# Patient Record
Sex: Male | Born: 1950 | Race: Black or African American | Hispanic: No | Marital: Single | Smoking: Former smoker
Health system: Southern US, Community
[De-identification: ages and names within clinical notes are randomized; demographics above are authoritative.]

## PROBLEM LIST (undated history)

## (undated) DIAGNOSIS — E785 Hyperlipidemia, unspecified: Secondary | ICD-10-CM

## (undated) DIAGNOSIS — I214 Non-ST elevation (NSTEMI) myocardial infarction: Secondary | ICD-10-CM

## (undated) DIAGNOSIS — H409 Unspecified glaucoma: Secondary | ICD-10-CM

## (undated) DIAGNOSIS — J449 Chronic obstructive pulmonary disease, unspecified: Secondary | ICD-10-CM

## (undated) DIAGNOSIS — I2510 Atherosclerotic heart disease of native coronary artery without angina pectoris: Secondary | ICD-10-CM

## (undated) HISTORY — PX: CARDIAC CATHETERIZATION: SHX172

## (undated) HISTORY — DX: Unspecified glaucoma: H40.9

---

## 1998-10-01 ENCOUNTER — Encounter: Payer: Self-pay | Admitting: Emergency Medicine

## 1998-10-01 ENCOUNTER — Emergency Department (HOSPITAL_COMMUNITY): Admission: EM | Admit: 1998-10-01 | Discharge: 1998-10-01 | Payer: Self-pay | Admitting: Emergency Medicine

## 1998-10-07 ENCOUNTER — Encounter: Admission: RE | Admit: 1998-10-07 | Discharge: 1999-01-05 | Payer: Self-pay | Admitting: Family Medicine

## 2005-02-25 ENCOUNTER — Encounter: Admission: RE | Admit: 2005-02-25 | Discharge: 2005-02-25 | Payer: Self-pay | Admitting: Sports Medicine

## 2009-06-12 ENCOUNTER — Emergency Department (HOSPITAL_COMMUNITY): Admission: EM | Admit: 2009-06-12 | Discharge: 2009-06-12 | Payer: Self-pay | Admitting: Emergency Medicine

## 2010-11-24 LAB — STREP A DNA PROBE: Group A Strep Probe: NEGATIVE

## 2012-04-03 ENCOUNTER — Ambulatory Visit: Payer: Non-veteran care | Attending: Internal Medicine | Admitting: Physical Therapy

## 2012-04-03 DIAGNOSIS — R262 Difficulty in walking, not elsewhere classified: Secondary | ICD-10-CM | POA: Insufficient documentation

## 2012-04-03 DIAGNOSIS — M25676 Stiffness of unspecified foot, not elsewhere classified: Secondary | ICD-10-CM | POA: Insufficient documentation

## 2012-04-03 DIAGNOSIS — M25579 Pain in unspecified ankle and joints of unspecified foot: Secondary | ICD-10-CM | POA: Insufficient documentation

## 2012-04-03 DIAGNOSIS — IMO0001 Reserved for inherently not codable concepts without codable children: Secondary | ICD-10-CM | POA: Insufficient documentation

## 2012-04-03 DIAGNOSIS — M25673 Stiffness of unspecified ankle, not elsewhere classified: Secondary | ICD-10-CM | POA: Insufficient documentation

## 2012-04-11 ENCOUNTER — Ambulatory Visit: Payer: Non-veteran care | Admitting: Physical Therapy

## 2012-04-15 ENCOUNTER — Ambulatory Visit: Payer: Non-veteran care | Admitting: Physical Therapy

## 2012-04-17 ENCOUNTER — Ambulatory Visit: Payer: Non-veteran care | Admitting: Physical Therapy

## 2012-04-23 ENCOUNTER — Ambulatory Visit: Payer: Non-veteran care | Attending: Internal Medicine | Admitting: Physical Therapy

## 2012-04-23 DIAGNOSIS — M25676 Stiffness of unspecified foot, not elsewhere classified: Secondary | ICD-10-CM | POA: Insufficient documentation

## 2012-04-23 DIAGNOSIS — IMO0001 Reserved for inherently not codable concepts without codable children: Secondary | ICD-10-CM | POA: Insufficient documentation

## 2012-04-23 DIAGNOSIS — M25673 Stiffness of unspecified ankle, not elsewhere classified: Secondary | ICD-10-CM | POA: Insufficient documentation

## 2012-04-23 DIAGNOSIS — M25579 Pain in unspecified ankle and joints of unspecified foot: Secondary | ICD-10-CM | POA: Insufficient documentation

## 2012-04-23 DIAGNOSIS — R262 Difficulty in walking, not elsewhere classified: Secondary | ICD-10-CM | POA: Insufficient documentation

## 2012-04-25 ENCOUNTER — Ambulatory Visit: Payer: Non-veteran care | Admitting: Physical Therapy

## 2012-05-01 ENCOUNTER — Encounter: Payer: Self-pay | Admitting: Physical Therapy

## 2012-05-06 ENCOUNTER — Ambulatory Visit: Payer: Non-veteran care | Admitting: Physical Therapy

## 2012-05-08 ENCOUNTER — Encounter: Payer: Self-pay | Admitting: Physical Therapy

## 2012-05-13 ENCOUNTER — Ambulatory Visit: Payer: Self-pay | Admitting: Physical Therapy

## 2012-05-16 ENCOUNTER — Encounter: Payer: Self-pay | Admitting: Physical Therapy

## 2012-08-22 ENCOUNTER — Ambulatory Visit: Payer: Non-veteran care | Attending: Internal Medicine | Admitting: Physical Therapy

## 2012-08-22 DIAGNOSIS — R262 Difficulty in walking, not elsewhere classified: Secondary | ICD-10-CM | POA: Insufficient documentation

## 2012-08-22 DIAGNOSIS — M25676 Stiffness of unspecified foot, not elsewhere classified: Secondary | ICD-10-CM | POA: Insufficient documentation

## 2012-08-22 DIAGNOSIS — M25673 Stiffness of unspecified ankle, not elsewhere classified: Secondary | ICD-10-CM | POA: Insufficient documentation

## 2012-08-22 DIAGNOSIS — IMO0001 Reserved for inherently not codable concepts without codable children: Secondary | ICD-10-CM | POA: Insufficient documentation

## 2012-08-22 DIAGNOSIS — M25579 Pain in unspecified ankle and joints of unspecified foot: Secondary | ICD-10-CM | POA: Insufficient documentation

## 2012-08-26 ENCOUNTER — Encounter: Payer: Non-veteran care | Admitting: Physical Therapy

## 2012-08-28 ENCOUNTER — Encounter: Payer: Non-veteran care | Admitting: Physical Therapy

## 2012-08-29 ENCOUNTER — Ambulatory Visit: Payer: Non-veteran care | Admitting: Physical Therapy

## 2012-09-02 ENCOUNTER — Ambulatory Visit: Payer: Non-veteran care | Admitting: Physical Therapy

## 2012-09-04 ENCOUNTER — Ambulatory Visit: Payer: Non-veteran care | Admitting: Physical Therapy

## 2012-09-09 ENCOUNTER — Ambulatory Visit: Payer: Non-veteran care | Admitting: Physical Therapy

## 2012-09-11 ENCOUNTER — Ambulatory Visit: Payer: Non-veteran care | Admitting: Physical Therapy

## 2012-09-12 ENCOUNTER — Ambulatory Visit: Payer: Non-veteran care | Admitting: Physical Therapy

## 2015-07-04 ENCOUNTER — Ambulatory Visit (INDEPENDENT_AMBULATORY_CARE_PROVIDER_SITE_OTHER): Payer: Medicare Other | Admitting: Family Medicine

## 2015-07-04 VITALS — BP 100/74 | HR 73 | Temp 98.6°F | Resp 14 | Wt 179.0 lb

## 2015-07-04 DIAGNOSIS — H409 Unspecified glaucoma: Secondary | ICD-10-CM

## 2015-07-04 DIAGNOSIS — S8002XA Contusion of left knee, initial encounter: Secondary | ICD-10-CM

## 2015-07-05 ENCOUNTER — Encounter: Payer: Self-pay | Admitting: Family Medicine

## 2015-07-05 ENCOUNTER — Ambulatory Visit: Payer: Self-pay

## 2015-07-05 ENCOUNTER — Ambulatory Visit (INDEPENDENT_AMBULATORY_CARE_PROVIDER_SITE_OTHER): Payer: Self-pay

## 2015-07-05 DIAGNOSIS — H409 Unspecified glaucoma: Secondary | ICD-10-CM | POA: Insufficient documentation

## 2015-07-05 DIAGNOSIS — S8002XA Contusion of left knee, initial encounter: Secondary | ICD-10-CM
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
## 2015-07-05 NOTE — Addendum Note (Signed)
Addended by: Johnnette LitterARDWELL, Nesta Kimple M on: 07/05/2015 01:46 PM   Modules accepted: Orders

## 2015-07-18 ENCOUNTER — Ambulatory Visit (INDEPENDENT_AMBULATORY_CARE_PROVIDER_SITE_OTHER): Payer: Self-pay | Admitting: Emergency Medicine

## 2015-07-18 ENCOUNTER — Ambulatory Visit (INDEPENDENT_AMBULATORY_CARE_PROVIDER_SITE_OTHER): Payer: Medicare Other

## 2015-07-18 ENCOUNTER — Ambulatory Visit (HOSPITAL_COMMUNITY)
Admission: RE | Admit: 2015-07-18 | Discharge: 2015-07-18 | Disposition: A | Discharge status: Home / Self Care | Payer: Non-veteran care | Source: Ambulatory Visit | Attending: Emergency Medicine | Admitting: Emergency Medicine

## 2015-07-18 VITALS — BP 132/70 | HR 66 | Temp 98.7°F | Resp 16 | Ht 68.0 in | Wt 178.8 lb

## 2015-07-18 DIAGNOSIS — M79662 Pain in left lower leg: Secondary | ICD-10-CM

## 2015-07-18 DIAGNOSIS — M25562 Pain in left knee: Secondary | ICD-10-CM

## 2015-07-18 DIAGNOSIS — M79609 Pain in unspecified limb: Secondary | ICD-10-CM | POA: Insufficient documentation

## 2015-07-18 DIAGNOSIS — M7989 Other specified soft tissue disorders: Secondary | ICD-10-CM | POA: Insufficient documentation
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
## 2015-07-18 NOTE — Patient Instructions (Signed)
GO OVER TO [facility], TELL THEM TO REGISTER YOU AS OUTPATIENT BECAUSE YOU HAVE AN APPOINT FOR A VENOUS DOPPLER.

## 2015-07-18 NOTE — Progress Notes (Signed)
VASCULAR LAB PRELIMINARY  PRELIMINARY  PRELIMINARY  PRELIMINARY  Bilateral lower extremity venous duplex completed.    Preliminary report:  There is no DVT or SVT noted in the bilateral lower extremities.   Jacorian Golaszewski, RVT 07/18/2015, 2:19 PM

## 2015-07-25 ENCOUNTER — Telehealth: Payer: Self-pay | Admitting: Radiology

## 2015-07-25 NOTE — Telephone Encounter (Signed)
Pt called concerning referral for ortho. Gave pt phone number for [facility] to confirm appt scheduling.

## 2016-09-13 ENCOUNTER — Encounter (HOSPITAL_COMMUNITY): Payer: Self-pay | Admitting: Emergency Medicine

## 2016-09-13 ENCOUNTER — Ambulatory Visit (HOSPITAL_COMMUNITY)
Admission: EM | Admit: 2016-09-13 | Discharge: 2016-09-13 | Disposition: A | Discharge status: Home / Self Care | Payer: 59 | Attending: Emergency Medicine | Admitting: Emergency Medicine

## 2016-09-13 DIAGNOSIS — B9789 Other viral agents as the cause of diseases classified elsewhere: Secondary | ICD-10-CM | POA: Diagnosis not present

## 2016-09-13 DIAGNOSIS — J069 Acute upper respiratory infection, unspecified: Secondary | ICD-10-CM

## 2016-09-13 MED ORDER — CETIRIZINE HCL 10 MG PO TABS: 10.0000 mg | ORAL_TABLET | Freq: Every day | ORAL | 0 refills | Status: DC

## 2016-09-13 MED ORDER — PREDNISONE 50 MG PO TABS: ORAL_TABLET | ORAL | 0 refills | Status: DC

## 2016-09-13 NOTE — ED Provider Notes (Signed)
[facility]    CSN: [id number] Arrival date & time: 09/13/16  1006     History   Chief Complaint Chief Complaint  Patient presents with  . URI    HPI Peter Lynn is a 66 y.o. male.   HPI He is a 66 year old man here for evaluation of upper respiratory symptoms. Symptoms started 2 days ago with some nasal congestion, body aches, and headache. He reports an intermittent cough. No ear pain, sore throat, wheezing, shortness of breath. No fevers. He has been drinking a lot of tea. He has also taken some Alka-Seltzer cold and Bayer aspirin with moderate improvement.  Past Medical History:  Diagnosis Date  . Glaucoma     Patient Active Problem List   Diagnosis Date Noted  . Glaucoma 07/05/2015    History reviewed. No pertinent surgical history.     Home Medications    Prior to Admission medications   Medication Sig Start Date End Date Taking? Authorizing Provider  cetirizine (ZYRTEC) 10 MG tablet Take 1 tablet (10 mg total) by mouth daily. 09/13/16   Charm Rings, MD  predniSONE (DELTASONE) 50 MG tablet Take 1 pill daily for 5 days. 09/13/16   Charm Rings, MD    Family History Family History  Problem Relation Age of Onset  . Diabetes Sister     Social History Social History  Substance Use Topics  . Smoking status: Never Smoker  . Smokeless tobacco: Never Used  . Alcohol use No     Allergies   Patient has no known allergies.   Review of Systems Review of Systems As in history of present illness  Physical Exam Triage Vital Signs ED Triage Vitals  Enc Vitals Group     BP 09/13/16 1028 160/94     Pulse Rate 09/13/16 1028 77     Resp 09/13/16 1028 20     Temp 09/13/16 1028 98.6 F (37 C)     Temp Source 09/13/16 1028 Oral     SpO2 09/13/16 1028 97 %     Weight --      Height --      Head Circumference --      Peak Flow --      Pain Score 09/13/16 1027 6     Pain Loc --      Pain Edu? --      Excl. in GC? --    No data  found.   Updated Vital Signs BP 160/94 (BP Location: Right Arm)   Pulse 77   Temp 98.6 F (37 C) (Oral)   Resp 20   SpO2 97%   Visual Acuity Right Eye Distance:   Left Eye Distance:   Bilateral Distance:    Right Eye Near:   Left Eye Near:    Bilateral Near:     Physical Exam  Constitutional: He is oriented to person, place, and time. He appears well-developed and well-nourished. No distress.  HENT:  Mouth/Throat: Oropharynx is clear and moist. No oropharyngeal exudate.  Minor nasal erythema. TMs normal bilaterally.  Neck: Neck supple.  Cardiovascular: Normal rate, regular rhythm and normal heart sounds.   No murmur heard. Pulmonary/Chest: Effort normal and breath sounds normal. No respiratory distress. He has no wheezes. He has no rales.  Lymphadenopathy:    He has no cervical adenopathy.  Neurological: He is alert and oriented to person, place, and time.     UC Treatments / Results  Labs (all labs ordered are listed, but  only abnormal results are displayed) Labs Reviewed - No data to display  EKG  EKG Interpretation None       Radiology No results found.  Procedures Procedures (including critical care time)  Medications Ordered in UC Medications - No data to display   Initial Impression / Assessment and Plan / UC Course  I have reviewed the triage vital signs and the nursing notes.  Pertinent labs & imaging results that were available during my care of the patient were reviewed by me and considered in my medical decision making (see chart for details).     Symptoms consistent with viral URI, likely common cold. Discussed symptomatic treatment. Will treat with some prednisone for symptomatic relief. Follow-up as needed.  Final Clinical Impressions(s) / UC Diagnoses   Final diagnoses:  Viral URI    New Prescriptions New Prescriptions   CETIRIZINE (ZYRTEC) 10 MG TABLET    Take 1 tablet (10 mg total) by mouth daily.   PREDNISONE (DELTASONE) 50  MG TABLET    Take 1 pill daily for 5 days.     Charm RingsErin J Michaiah Maiden, MD 09/13/16 1052

## 2016-09-13 NOTE — Discharge Instructions (Signed)
You have a viral upper respiratory infection. Continue Alka-Seltzer cold and teas. Take the prednisone daily for 5 days. Take cetirizine daily for the next week. You should start to see improvement over the next 2-3 days. Follow-up as needed.

## 2016-09-13 NOTE — ED Triage Notes (Signed)
Pt c/o cold sx onset: 3 days  Sx include: cough, chills, HA, BA, nasal congestion/drainage, decreased appetite   Denies: fevers  Taking: Hot tea and OTC cold meds  A&O x4... NAD

## 2017-03-16 ENCOUNTER — Encounter (HOSPITAL_COMMUNITY): Payer: Self-pay | Admitting: *Deleted

## 2017-03-16 ENCOUNTER — Ambulatory Visit (HOSPITAL_COMMUNITY)
Admission: EM | Admit: 2017-03-16 | Discharge: 2017-03-16 | Disposition: A | Discharge status: Home / Self Care | Payer: 59 | Attending: Physician Assistant | Admitting: Physician Assistant

## 2017-03-16 DIAGNOSIS — R0981 Nasal congestion: Secondary | ICD-10-CM | POA: Diagnosis not present

## 2017-03-16 HISTORY — DX: Chronic obstructive pulmonary disease, unspecified: J44.9

## 2017-03-16 MED ORDER — CETIRIZINE-PSEUDOEPHEDRINE ER 5-120 MG PO TB12: 1.0000 | ORAL_TABLET | Freq: Every day | ORAL | 0 refills | Status: DC

## 2017-03-16 NOTE — ED Provider Notes (Signed)
CSN: [id number]     Arrival date & time 03/16/17  1042 History   None    Chief Complaint  Patient presents with  . Shortness of Breath   (Consider location/radiation/quality/duration/timing/severity/associated sxs/prior Treatment) 66 year old male comes in with few months history of nasal congestion. States he also had episodes of chest pain, chest tightness and shortness of breath. Had chest pain 3 days ago while he was doing activity, relieved by rest, chest pain restarted after he recovered activity. He took a baby aspirin and rested, which resolved his chest pain. Denies history of CAD. Does not follow up with PCP as of right now. Also states he has some nasal congestion, has been happening for a few months, has not taken anything for it. Worse in the morning. States he was mowing the lawn today, and felt some throat irritation. Denies chest pain palpitation, shortness of breath right now. Denies wheezing, dizzy states he was told he has mild COPD in the past, not on any medication. Denies other URI symptoms such as fever, chills, night sweats, sore throat, cough.    Shortness of Breath    Past Medical History:  Diagnosis Date  . COPD (chronic obstructive pulmonary disease) (HCC)   . Glaucoma    History reviewed. No pertinent surgical history. Family History  Problem Relation Age of Onset  . Diabetes Sister    Social History  Substance Use Topics  . Smoking status: Never Smoker  . Smokeless tobacco: Never Used  . Alcohol use No    Review of Systems  Reason unable to perform ROS: See history of present illness as above.  Respiratory: Positive for shortness of breath.     Allergies  Patient has no known allergies.  Home Medications   Prior to Admission medications   Medication Sig Start Date End Date Taking? Authorizing Provider  cetirizine-pseudoephedrine (ZYRTEC-D) 5-120 MG tablet Take 1 tablet by mouth daily. 03/16/17   Cathie HoopsYu, Islam Eichinger V, PA-C  predniSONE (DELTASONE) 50 MG  tablet Take 1 pill daily for 5 days. 09/13/16   Charm RingsHonig, Erin J, MD   Meds Ordered and Administered this Visit  Medications - No data to display  BP 134/76 (BP Location: Right Arm)   Pulse 72   Temp 98.6 F (37 C)   Resp 18   SpO2 100%  No data found.   Physical Exam  Constitutional: He is oriented to person, place, and time. He appears well-developed and well-nourished. No distress.  HENT:  Head: Normocephalic and atraumatic.  Right Ear: Tympanic membrane, external ear and ear canal normal. Tympanic membrane is not erythematous and not bulging.  Left Ear: Tympanic membrane, external ear and ear canal normal. Tympanic membrane is not erythematous and not bulging.  Nose: Nose normal. Right sinus exhibits no maxillary sinus tenderness and no frontal sinus tenderness. Left sinus exhibits no maxillary sinus tenderness and no frontal sinus tenderness.  Mouth/Throat: Uvula is midline, oropharynx is clear and moist and mucous membranes are normal.  Eyes: Pupils are equal, round, and reactive to light. Conjunctivae are normal.  Neck: Normal range of motion. Neck supple.  Cardiovascular: Normal rate, regular rhythm and normal heart sounds.  Exam reveals no gallop and no friction rub.   No murmur heard. Pulmonary/Chest: Effort normal and breath sounds normal. He has no decreased breath sounds. He has no wheezes. He has no rhonchi. He has no rales.  Lymphadenopathy:    He has no cervical adenopathy.  Neurological: He is alert and oriented to person, place,  and time.  Skin: Skin is warm and dry.  Psychiatric: He has a normal mood and affect. His behavior is normal. Judgment normal.    Urgent Care Course     Procedures (including critical care time)  Labs Review Labs Reviewed - No data to display  Imaging Review No results found.      MDM   1. Nasal congestion    Discussed with patient exam was normal, O2 sat at 100%. Given he is not experiencing chest pain right now, will defer  EKG. Discussed with patient history of chest pain consistent with angina, to follow-up with PCP for further cardiac workup. Patient to go to the ED if experiencing chest pain, shortness of breath, not relieved by rest for aspirin. Given patient with nasal congestion, start Zyrtec-D. Patient to monitor for worsening of symptoms such as fever, shortness of breath, wheezing, nausea, vomiting, cough, to follow up for reevaluation.   Belinda FisherYu, Shamere Dilworth V, PA-C 03/16/17 1218

## 2017-03-16 NOTE — ED Triage Notes (Signed)
Pt  Reports  A  History  Of  Mild  Copd   He  Reports   sev  Days  Ago  He  Had   Some  Shortness  Of  Breath  As   Well  As   Some  Coughing   And  Tightness   In  Chest  He  Is  Better  today    He  Was   Working  Outside  Then

## 2017-03-16 NOTE — Discharge Instructions (Signed)
Start Zyrtec-D for nasal congestion. Follow-up with primary care for further workup, possible cardiology workup. Monitor for worsening of symptoms. If you're experiencing chest pain that is not relieved by rest, go to the emergency department for evaluation.

## 2017-03-27 ENCOUNTER — Inpatient Hospital Stay (HOSPITAL_COMMUNITY)
Admission: EM | Admit: 2017-03-27 | Discharge: 2017-03-28 | DRG: 247 | Disposition: A | Discharge status: Home / Self Care | Payer: Medicare Other | Attending: Cardiology | Admitting: Cardiology

## 2017-03-27 ENCOUNTER — Emergency Department (HOSPITAL_COMMUNITY): Payer: Medicare Other

## 2017-03-27 ENCOUNTER — Other Ambulatory Visit: Payer: Self-pay

## 2017-03-27 ENCOUNTER — Encounter (HOSPITAL_COMMUNITY): Payer: Self-pay

## 2017-03-27 ENCOUNTER — Encounter (HOSPITAL_COMMUNITY)
Admission: EM | Disposition: A | Discharge status: Home / Self Care | Payer: Self-pay | Source: Home / Self Care | Attending: Cardiology

## 2017-03-27 DIAGNOSIS — I700 Atherosclerosis of aorta: Secondary | ICD-10-CM | POA: Diagnosis present

## 2017-03-27 DIAGNOSIS — I2510 Atherosclerotic heart disease of native coronary artery without angina pectoris: Secondary | ICD-10-CM

## 2017-03-27 DIAGNOSIS — Z8249 Family history of ischemic heart disease and other diseases of the circulatory system: Secondary | ICD-10-CM

## 2017-03-27 DIAGNOSIS — E785 Hyperlipidemia, unspecified: Secondary | ICD-10-CM

## 2017-03-27 DIAGNOSIS — Z955 Presence of coronary angioplasty implant and graft: Secondary | ICD-10-CM

## 2017-03-27 DIAGNOSIS — J449 Chronic obstructive pulmonary disease, unspecified: Secondary | ICD-10-CM | POA: Diagnosis present

## 2017-03-27 DIAGNOSIS — I214 Non-ST elevation (NSTEMI) myocardial infarction: Secondary | ICD-10-CM | POA: Diagnosis not present

## 2017-03-27 DIAGNOSIS — H409 Unspecified glaucoma: Secondary | ICD-10-CM | POA: Diagnosis present

## 2017-03-27 DIAGNOSIS — E876 Hypokalemia: Secondary | ICD-10-CM

## 2017-03-27 HISTORY — PX: LEFT HEART CATH AND CORONARY ANGIOGRAPHY: CATH118249

## 2017-03-27 HISTORY — PX: CORONARY STENT INTERVENTION: CATH118234

## 2017-03-27 HISTORY — DX: Atherosclerotic heart disease of native coronary artery without angina pectoris: I25.10

## 2017-03-27 HISTORY — DX: Hyperlipidemia, unspecified: E78.5

## 2017-03-27 HISTORY — DX: Non-ST elevation (NSTEMI) myocardial infarction: I21.4

## 2017-03-27 LAB — LIPID PANEL
Cholesterol: 155 mg/dL (ref 0–200)
HDL: 49 mg/dL (ref >40)
LDL CALC: 97 mg/dL (ref 0–99)
Total CHOL/HDL Ratio: 3.2 RATIO
Triglycerides: 45 mg/dL (ref <150)
VLDL: 9 mg/dL (ref 0–40)

## 2017-03-27 LAB — I-STAT TROPONIN, ED: Troponin i, poc: 5.49 ng/mL (ref 0.00–0.08)

## 2017-03-27 LAB — HEPATIC FUNCTION PANEL
ALBUMIN: 4 g/dL (ref 3.5–5.0)
ALT: 22 U/L (ref 17–63)
AST: 93 U/L — AB (ref 15–41)
Alkaline Phosphatase: 38 U/L (ref 38–126)
Bilirubin, Direct: 0.1 mg/dL (ref 0.1–0.5)
Indirect Bilirubin: 0.8 mg/dL (ref 0.3–0.9)
TOTAL PROTEIN: 7 g/dL (ref 6.5–8.1)
Total Bilirubin: 0.9 mg/dL (ref 0.3–1.2)

## 2017-03-27 LAB — POCT ACTIVATED CLOTTING TIME
ACTIVATED CLOTTING TIME: 290 s
Activated Clotting Time: 257 seconds

## 2017-03-27 LAB — CBC
HCT: 36.6 % — ABNORMAL LOW (ref 39.0–52.0)
HEMATOCRIT: 36.5 % — AB (ref 39.0–52.0)
HEMOGLOBIN: 11.9 g/dL — AB (ref 13.0–17.0)
HEMOGLOBIN: 11.9 g/dL — AB (ref 13.0–17.0)
MCH: 27.7 pg (ref 26.0–34.0)
MCH: 27.8 pg (ref 26.0–34.0)
MCHC: 32.6 g/dL (ref 30.0–36.0)
MCHC: 32.5 g/dL (ref 30.0–36.0)
MCV: 84.9 fL (ref 78.0–100.0)
MCV: 85.5 fL (ref 78.0–100.0)
Platelets: 172 10*3/uL (ref 150–400)
Platelets: 156 10*3/uL (ref 150–400)
RBC: 4.3 MIL/uL (ref 4.22–5.81)
RBC: 4.28 MIL/uL (ref 4.22–5.81)
RDW: 13.5 % (ref 11.5–15.5)
RDW: 13.7 % (ref 11.5–15.5)
WBC: 6.3 10*3/uL (ref 4.0–10.5)
WBC: 6.6 10*3/uL (ref 4.0–10.5)

## 2017-03-27 LAB — CREATININE, SERUM
CREATININE: 1.08 mg/dL (ref 0.61–1.24)
GFR calc Af Amer: >60 mL/min (ref >60)

## 2017-03-27 LAB — BASIC METABOLIC PANEL
Anion gap: 12 (ref 5–15)
BUN: 10 mg/dL (ref 6–20)
CHLORIDE: 102 mmol/L (ref 101–111)
CO2: 24 mmol/L (ref 22–32)
Calcium: 9.3 mg/dL (ref 8.9–10.3)
Creatinine, Ser: 1.18 mg/dL (ref 0.61–1.24)
GFR calc Af Amer: >60 mL/min (ref >60)
GFR calc non Af Amer: >60 mL/min (ref >60)
Glucose, Bld: 99 mg/dL (ref 65–99)
POTASSIUM: 3.4 mmol/L — AB (ref 3.5–5.1)
SODIUM: 138 mmol/L (ref 135–145)

## 2017-03-27 LAB — TSH: TSH: 1.993 u[IU]/mL (ref 0.350–4.500)

## 2017-03-27 LAB — URINALYSIS, ROUTINE W REFLEX MICROSCOPIC
Bilirubin Urine: NEGATIVE
Glucose, UA: NEGATIVE mg/dL
Hgb urine dipstick: NEGATIVE
Ketones, ur: 20 mg/dL — AB
LEUKOCYTES UA: NEGATIVE
NITRITE: NEGATIVE
PH: 6 (ref 5.0–8.0)
Protein, ur: NEGATIVE mg/dL

## 2017-03-27 LAB — TROPONIN I
Troponin I: 6.36 ng/mL (ref <0.03)
Troponin I: 11.71 ng/mL (ref <0.03)

## 2017-03-27 SURGERY — LEFT HEART CATH AND CORONARY ANGIOGRAPHY
Anesthesia: LOCAL

## 2017-03-27 MED ORDER — LIDOCAINE HCL (PF) 1 % IJ SOLN
INTRAMUSCULAR | Status: AC
  Filled 2017-03-27: qty 30

## 2017-03-27 MED ORDER — TICAGRELOR 90 MG PO TABS
ORAL_TABLET | ORAL | Status: AC
  Filled 2017-03-27: qty 2

## 2017-03-27 MED ORDER — FENTANYL CITRATE (PF) 100 MCG/2ML IJ SOLN
INTRAMUSCULAR | Status: AC
  Filled 2017-03-27: qty 2

## 2017-03-27 MED ORDER — IOPAMIDOL (ISOVUE-370) INJECTION 76%
INTRAVENOUS | Status: AC
  Filled 2017-03-27: qty 100

## 2017-03-27 MED ORDER — LIDOCAINE HCL (PF) 1 % IJ SOLN
INTRAMUSCULAR | Status: DC | PRN
  Administered 2017-03-27: 2 mL

## 2017-03-27 MED ORDER — HEPARIN BOLUS VIA INFUSION
4000.0000 [IU] | Freq: Once | INTRAVENOUS | Status: AC
  Administered 2017-03-27: 4000 [IU] via INTRAVENOUS
  Filled 2017-03-27: qty 4000

## 2017-03-27 MED ORDER — HEPARIN (PORCINE) IN NACL 2-0.9 UNIT/ML-% IJ SOLN
INTRAMUSCULAR | Status: AC
  Filled 2017-03-27: qty 1000

## 2017-03-27 MED ORDER — ONDANSETRON HCL 4 MG/2ML IJ SOLN
4.0000 mg | Freq: Four times a day (QID) | INTRAMUSCULAR | Status: DC | PRN
Start: 2017-03-27 — End: 2017-03-28

## 2017-03-27 MED ORDER — ATORVASTATIN CALCIUM 80 MG PO TABS
80.0000 mg | ORAL_TABLET | Freq: Every day | ORAL | Status: DC
  Administered 2017-03-27: 80 mg via ORAL
  Filled 2017-03-27: qty 1

## 2017-03-27 MED ORDER — VERAPAMIL HCL 2.5 MG/ML IV SOLN
INTRAVENOUS | Status: AC
  Filled 2017-03-27: qty 2

## 2017-03-27 MED ORDER — SODIUM CHLORIDE 0.9% FLUSH
3.0000 mL | Freq: Two times a day (BID) | INTRAVENOUS | Status: DC
  Administered 2017-03-28: 10:00:00 3 mL via INTRAVENOUS

## 2017-03-27 MED ORDER — ENOXAPARIN SODIUM 40 MG/0.4ML ~~LOC~~ SOLN
40.0000 mg | SUBCUTANEOUS | Status: DC
  Administered 2017-03-28: 08:00:00 40 mg via SUBCUTANEOUS
  Filled 2017-03-27: qty 0.4

## 2017-03-27 MED ORDER — HEPARIN (PORCINE) IN NACL 100-0.45 UNIT/ML-% IJ SOLN
1050.0000 [IU]/h | INTRAMUSCULAR | Status: DC
  Administered 2017-03-27: 1050 [IU]/h via INTRAVENOUS
  Filled 2017-03-27: qty 250

## 2017-03-27 MED ORDER — TICAGRELOR 90 MG PO TABS
ORAL_TABLET | ORAL | Status: DC | PRN
  Administered 2017-03-27: 180 mg via ORAL

## 2017-03-27 MED ORDER — MIDAZOLAM HCL 2 MG/2ML IJ SOLN
INTRAMUSCULAR | Status: AC
  Filled 2017-03-27: qty 2

## 2017-03-27 MED ORDER — ACETAMINOPHEN 325 MG PO TABS: 650.0000 mg | ORAL_TABLET | ORAL | Status: DC | PRN

## 2017-03-27 MED ORDER — SODIUM CHLORIDE 0.9 % IV SOLN: 250.0000 mL | INTRAVENOUS | Status: DC | PRN

## 2017-03-27 MED ORDER — HEPARIN (PORCINE) IN NACL 2-0.9 UNIT/ML-% IJ SOLN
INTRAMUSCULAR | Status: AC | PRN
  Administered 2017-03-27: 1000 mL

## 2017-03-27 MED ORDER — ACTIVE PARTNERSHIP FOR HEALTH OF YOUR HEART BOOK
Freq: Once | Status: AC
  Administered 2017-03-27: 20:00:00
  Filled 2017-03-27: qty 1

## 2017-03-27 MED ORDER — NITROGLYCERIN 0.4 MG SL SUBL
0.4000 mg | SUBLINGUAL_TABLET | SUBLINGUAL | Status: DC | PRN
  Administered 2017-03-27: 0.4 mg via SUBLINGUAL
  Filled 2017-03-27: qty 1

## 2017-03-27 MED ORDER — IOPAMIDOL (ISOVUE-370) INJECTION 76%
INTRAVENOUS | Status: AC
  Filled 2017-03-27: qty 50

## 2017-03-27 MED ORDER — NITROGLYCERIN 1 MG/10 ML FOR IR/CATH LAB
INTRA_ARTERIAL | Status: DC | PRN
  Administered 2017-03-27: 200 ug via INTRACORONARY

## 2017-03-27 MED ORDER — SODIUM CHLORIDE 0.9% FLUSH: 3.0000 mL | INTRAVENOUS | Status: DC | PRN

## 2017-03-27 MED ORDER — ASPIRIN EC 325 MG PO TBEC
325.0000 mg | DELAYED_RELEASE_TABLET | Freq: Once | ORAL | Status: AC
Start: 2017-03-27 — End: 2017-03-27
  Administered 2017-03-27: 325 mg via ORAL
  Filled 2017-03-27: qty 1

## 2017-03-27 MED ORDER — FENTANYL CITRATE (PF) 100 MCG/2ML IJ SOLN
INTRAMUSCULAR | Status: DC | PRN
Start: 2017-03-27 — End: 2017-03-27
  Administered 2017-03-27: 25 ug via INTRAVENOUS

## 2017-03-27 MED ORDER — HEPARIN SODIUM (PORCINE) 1000 UNIT/ML IJ SOLN
INTRAMUSCULAR | Status: DC | PRN
  Administered 2017-03-27 (×2): 3000 [IU] via INTRAVENOUS
  Administered 2017-03-27: 4500 [IU] via INTRAVENOUS

## 2017-03-27 MED ORDER — NITROGLYCERIN 0.4 MG SL SUBL: 0.4000 mg | SUBLINGUAL_TABLET | SUBLINGUAL | Status: DC | PRN

## 2017-03-27 MED ORDER — TICAGRELOR 90 MG PO TABS
90.0000 mg | ORAL_TABLET | Freq: Two times a day (BID) | ORAL | Status: DC
  Administered 2017-03-28 (×2): 90 mg via ORAL
  Filled 2017-03-27 (×2): qty 1

## 2017-03-27 MED ORDER — MIDAZOLAM HCL 2 MG/2ML IJ SOLN
INTRAMUSCULAR | Status: DC | PRN
  Administered 2017-03-27: 1 mg via INTRAVENOUS

## 2017-03-27 MED ORDER — ANGIOPLASTY BOOK
Freq: Once | Status: AC
  Administered 2017-03-27: 20:00:00
  Filled 2017-03-27: qty 1

## 2017-03-27 MED ORDER — SODIUM CHLORIDE 0.9 % WEIGHT BASED INFUSION: 1.0000 mL/kg/h | INTRAVENOUS | Status: AC

## 2017-03-27 MED ORDER — IOPAMIDOL (ISOVUE-370) INJECTION 76%
INTRAVENOUS | Status: DC | PRN
  Administered 2017-03-27: 215 mL via INTRA_ARTERIAL

## 2017-03-27 MED ORDER — NITROGLYCERIN 1 MG/10 ML FOR IR/CATH LAB
INTRA_ARTERIAL | Status: AC
  Filled 2017-03-27: qty 10

## 2017-03-27 MED ORDER — ASPIRIN EC 81 MG PO TBEC
81.0000 mg | DELAYED_RELEASE_TABLET | Freq: Every day | ORAL | Status: DC
  Administered 2017-03-28: 10:00:00 81 mg via ORAL
  Filled 2017-03-27: qty 1

## 2017-03-27 MED ORDER — VERAPAMIL HCL 2.5 MG/ML IV SOLN
INTRAVENOUS | Status: DC | PRN
  Administered 2017-03-27: 10 mL via INTRA_ARTERIAL

## 2017-03-27 MED ORDER — HEPARIN SODIUM (PORCINE) 1000 UNIT/ML IJ SOLN
INTRAMUSCULAR | Status: AC
  Filled 2017-03-27: qty 1

## 2017-03-27 MED ORDER — HEART ATTACK BOUNCING BOOK
Freq: Once | Status: AC
  Administered 2017-03-27: 20:00:00
  Filled 2017-03-27: qty 1

## 2017-03-27 SURGICAL SUPPLY — 25 items
BALLN EMERGE MR 2.5X12 (BALLOONS) ×2
BALLN ~~LOC~~ EUPHORA RX 4.0X20 (BALLOONS) ×2
BALLOON EMERGE MR 2.5X12 (BALLOONS) IMPLANT
BALLOON ~~LOC~~ EUPHORA RX 4.0X20 (BALLOONS) IMPLANT
CATH 5FR JL3.5 JR4 ANG PIG MP (CATHETERS) ×1 IMPLANT
CATH LAUNCHER 5F JL3 (CATHETERS) IMPLANT
CATH LAUNCHER 6FR AL1 (CATHETERS) IMPLANT
CATH OPTITORQUE JACKY 4.0 5F (CATHETERS) ×1 IMPLANT
CATHETER LAUNCHER 5F JL3 (CATHETERS) ×2
CATHETER LAUNCHER 6FR AL1 (CATHETERS) ×2
COVER PRB 48X5XTLSCP FOLD TPE (BAG) IMPLANT
COVER PROBE 5X48 (BAG) ×2
DEVICE RAD COMP TR BAND LRG (VASCULAR PRODUCTS) ×1 IMPLANT
GLIDESHEATH SLEND SS 6F .021 (SHEATH) ×1 IMPLANT
GUIDEWIRE INQWIRE 1.5J.035X260 (WIRE) IMPLANT
INQWIRE 1.5J .035X260CM (WIRE) ×2
KIT ENCORE 26 ADVANTAGE (KITS) ×1 IMPLANT
KIT HEART LEFT (KITS) ×2 IMPLANT
PACK CARDIAC CATHETERIZATION (CUSTOM PROCEDURE TRAY) ×2 IMPLANT
STENT RESOLUTE ONYX 4.0X30 (Permanent Stent) ×1 IMPLANT
SYR MEDRAD MARK V 150ML (SYRINGE) ×2 IMPLANT
TRANSDUCER W/STOPCOCK (MISCELLANEOUS) ×2 IMPLANT
TUBING CIL FLEX 10 FLL-RA (TUBING) ×2 IMPLANT
WIRE ASAHI PROWATER 180CM (WIRE) ×1 IMPLANT
WIRE PT2 MS 185 (WIRE) ×1 IMPLANT

## 2017-03-27 NOTE — ED Notes (Signed)
Cardiology at bedside.

## 2017-03-27 NOTE — Progress Notes (Signed)
TR BAND REMOVAL  LOCATION:    right radial  DEFLATED PER PROTOCOL:    Yes.    TIME BAND OFF / DRESSING APPLIED:    20:15   SITE UPON ARRIVAL:    Level 0  SITE AFTER BAND REMOVAL:    Level 0  CIRCULATION SENSATION AND MOVEMENT:    Within Normal Limits   Yes.    COMMENTS:   Post TR band instructions given. Pt tolerated well. 

## 2017-03-27 NOTE — ED Notes (Signed)
Patient transported to X-ray 

## 2017-03-27 NOTE — Progress Notes (Signed)
ANTICOAGULATION CONSULT NOTE - Initial Consult  Pharmacy Consult for heparin Indication: chest pain/ACS  No Known Allergies  Patient Measurements: Height: 5\' 9"  (175.3 cm) Weight: 192 lb (87.1 kg) IBW/kg (Calculated) : 70.7 Heparin Dosing Weight: 87 Kg  Vital Signs: Temp: 98.4 F (36.9 C) (08/07 1119) Temp Source: Oral (08/07 1119) BP: 128/76 (08/07 1119) Pulse Rate: 61 (08/07 1119)  Labs:  Recent Labs  03/27/17 1121 03/27/17 1208  HGB 11.9*  --   HCT 36.5*  --   PLT 172  --   CREATININE 1.18  --   TROPONINI  --  6.36*    Estimated Creatinine Clearance: 65.5 mL/min (by C-G formula based on SCr of 1.18 mg/dL).   Medical History: Past Medical History:  Diagnosis Date  . COPD (chronic obstructive pulmonary disease) (HCC)   . Glaucoma    Assessment: Peter Lynn is a 66 y.o. male with no prior Medical history except COPD, here for chest pain. He endorses not taking any prescribed medications PTA. Elevated Troponin 6.36. HgB 11.9 Scheduled for cath later today.   Goal of Therapy:  Heparin level 0.3-0.7 units/ml Monitor platelets by anticoagulation protocol: Yes   Plan:  Give 4000 units bolus x 1 Start heparin infusion at 1050 units/hr Check anti-Xa level in 6 hours and daily while on heparin Continue to monitor H&H and platelets  Tate Jerkins L Annaliyah Willig 03/27/2017,1:27 PM

## 2017-03-27 NOTE — ED Triage Notes (Signed)
Pt states generalized fatigue, sob and CP yesterday. Pt denies chest pain today. Skin warm and dry. Pt states he has become more fatigued doing daily activities. No distress noted. Lung sounds clear on auscultation.

## 2017-03-27 NOTE — Interval H&P Note (Signed)
History and Physical Interval Note:  03/27/2017 2:39 PM  Peter RoeJamaal Leach  has presented today for surgery, with the diagnosis of n stemi  The various methods of treatment have been discussed with the patient and family. After consideration of risks, benefits and other options for treatment, the patient has consented to  Procedure(s): LEFT HEART CATH AND CORONARY ANGIOGRAPHY (N/A) as a surgical intervention .  The patient's history has been reviewed, patient examined, no change in status, stable for surgery.  I have reviewed the patient's chart and labs.  Questions were answered to the patient's satisfaction.   Cath Lab Visit (complete for each Cath Lab visit)  Clinical Evaluation Leading to the Procedure:   ACS: Yes.    Non-ACS:    Anginal Classification: CCS IV  Anti-ischemic medical therapy: No Therapy  Non-Invasive Test Results: No non-invasive testing performed  Prior CABG: No previous CABG       Theron Aristaeter [facility] Cross HospitalJordanMD,FACC 03/27/2017 2:39 PM

## 2017-03-27 NOTE — ED Provider Notes (Signed)
[facility]-EMERGENCY DEPT Provider Note   CSN: [id number] Arrival date & time: 03/27/17  1109     History   Chief Complaint Chief Complaint  Patient presents with  . Fatigue  . Shortness of Breath    HPI Peter Lynn is a 66 y.o. male.  Pt presents to the ED today with cp, sob, and fatigue.  The pt said that his sx started on 7/27 with exertion.  He went to urgent care initially and work up there was negative.  He was told to f/u with pcp for cardiac work up and come to the ED if he has any more CP.  He does not have a pcp, so has not followed up.  Yesterday, he was at work and started having cp again.  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  He had cp, felt very sob and fatigued.  He had to stop and rest.  He went back to doing his work and sx returned.  He was able to finish the day by resting, but said he did not feel well all day.  He tried to mow his lawn this morning with a push mower and developed the same sx.  He had to rest and pain went away.  He came into the ED b/c he did not feel right.      Past Medical History:  Diagnosis Date  . COPD (chronic obstructive pulmonary disease) (HCC)   . Glaucoma     Patient Active Problem List   Diagnosis Date Noted  . NSTEMI (non-ST elevated myocardial infarction) (HCC)   . Glaucoma 07/05/2015    History reviewed. No pertinent surgical history.     Home Medications    Prior to Admission medications   Medication Sig Start Date End Date Taking? Authorizing Provider  cetirizine-pseudoephedrine (ZYRTEC-D) 5-120 MG tablet Take 1 tablet by mouth daily. 03/16/17   Cathie Hoops, Amy V, PA-C  predniSONE (DELTASONE) 50 MG tablet Take 1 pill daily for 5 days. 09/13/16   Charm Rings, MD    Family History Family History  Problem Relation Age of Onset  . Diabetes Sister     Social History Social History  Substance Use Topics  . Smoking status: Never Smoker  . Smokeless tobacco: Never Used  . 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  Allergies   Patient has no known allergies.   Review of Systems Review of Systems  Constitutional: Positive for fatigue.  Respiratory: Positive for shortness of breath.   Cardiovascular: Positive for chest pain.  All other systems reviewed and are negative.    Physical Exam Updated Vital Signs BP 128/76 (BP Location: Left Arm)   Pulse 61   Temp 98.4 F (36.9 C) (Oral)   Resp 16   Ht 5\' 9"  (1.753 m)   Wt 87.1 kg (192 lb)   SpO2 99%   BMI 28.35 kg/m   Physical Exam  Constitutional: He is oriented to person, place, and time. He appears well-developed and well-nourished.  HENT:  Head: Normocephalic and atraumatic.  Right Ear: External ear normal.  Left Ear: External ear normal.  Nose: Nose normal.  Mouth/Throat: Oropharynx is clear and moist.  Eyes: Pupils are equal, round, and reactive to light. Conjunctivae and EOM are normal.  Neck: Normal range of motion. Neck supple.  Cardiovascular: Normal rate, regular rhythm, normal heart sounds and intact distal pulses.   Pulmonary/Chest:  Effort normal and breath sounds normal.  Abdominal: Soft. Bowel sounds are normal.  Musculoskeletal: Normal range of motion.  Neurological: He is alert and oriented to person, place, and time.  Skin: Skin is warm.  Psychiatric: He has a normal mood and affect. His behavior is normal. Judgment and thought content normal.  Nursing note and vitals reviewed.    ED Treatments / Results  Labs (all labs ordered are listed, but only abnormal results are displayed) Labs Reviewed  BASIC METABOLIC PANEL - Abnormal; Notable for the following:       Result Value   Potassium 3.4 (*)    All other components within normal limits  CBC - Abnormal; Notable for the following:    Hemoglobin 11.9 (*)    HCT 36.5 (*)    All other components within normal limits  HEPATIC FUNCTION PANEL - Abnormal; Notable for the following:    AST 93 (*)     All other components within normal limits  TROPONIN I - Abnormal; Notable for the following:    Troponin I 6.36 (*)    All other components within normal limits  I-STAT TROPONIN, ED - Abnormal; Notable for the following:    Troponin i, poc 5.49 (*)    All other components within normal limits  TSH  URINALYSIS, ROUTINE W REFLEX MICROSCOPIC  HEPARIN LEVEL (UNFRACTIONATED)    EKG  EKG Interpretation  Date/Time:  Tuesday March 27 2017 11:16:58 EDT Ventricular Rate:  65 PR Interval:  150 QRS Duration: 92 QT Interval:  398 QTC Calculation: 413 R Axis:   -21 Text Interpretation:  Normal sinus rhythm Left ventricular hypertrophy with repolarization abnormality Abnormal ECG No old tracing to compare Confirmed by Jacalyn LefevreHaviland, Selma Mink [phone]) on 03/27/2017 11:53:21 AM       Radiology Dg Chest 2 View  Result Date: 03/27/2017 CLINICAL DATA:  Shortness of breath. EXAM: CHEST  2 VIEW COMPARISON:  None. FINDINGS: The cardiomediastinal silhouette is normal in size. Normal pulmonary vascularity. Atherosclerotic calcification of the aortic arch. No focal consolidation, pleural effusion, or pneumothorax. No acute osseous abnormality. IMPRESSION: 1. No active cardiopulmonary disease. 2.  Aortic Atherosclerosis (ICD10-I70.0). Electronically Signed   By: Obie DredgeWilliam T Derry M.D.   On: 03/27/2017 12:16    Procedures Procedures (including critical care time)  Medications Ordered in ED Medications  nitroGLYCERIN (NITROSTAT) SL tablet 0.4 mg (0.4 mg Sublingual Given 03/27/17 1218)  heparin ADULT infusion 100 units/mL (25000 units/23350mL sodium chloride 0.45%) (1,050 Units/hr Intravenous New Bag/Given 03/27/17 1334)  aspirin EC tablet 325 mg (325 mg Oral Given 03/27/17 1218)  heparin bolus via infusion 4,000 Units (4,000 Units Intravenous Bolus from Bag 03/27/17 1334)     Initial Impression / Assessment and Plan / ED Course  I have reviewed the triage vital signs and the nursing notes.  Pertinent labs & imaging  results that were available during my care of the patient were reviewed by me and considered in my medical decision making (see chart for details).    Pt d/w cardiology.  Dr. SwazilandJordan will admit and plans to take pt to cath lab.  Final Clinical Impressions(s) / ED Diagnoses   Final diagnoses:  NSTEMI (non-ST elevated myocardial infarction) [facility](HCC)    New Prescriptions New Prescriptions   No medications on file     Jacalyn LefevreHaviland, Joriel Streety, MD 03/27/17 1344

## 2017-03-27 NOTE — H&P (Signed)
Cardiology Admission History and Physical:   Patient ID: Peter Lynn; MRN: [id number]; DOB: [birth year]   Admission date: 03/27/2017  Primary Care Provider: Patient, No Pcp Per Primary Cardiologist: New to Dr. Swaziland   Chief Complaint:  Chest pain   Patient Profile:   Peter Lynn is a 66 y.o. male with no prior Medical history except COPD presented to [facility] ER for evaluation of chest pain by EMS.  Patient had a normal physical at the [place] few months ago including normal carotid and abdominal ultrasound. Denies tobacco abuse or illicit drug use. Occasional alcohol use. Mother had a MI at age 39.  History of Present Illness:   Peter Lynn has noted recent dyspnea on exertion and nasal congestion for the past couple of weeks. Evaluated in ER 03/16/17--> no EKG to troponin check however advised to follow-up with PCP for symptoms concerning for angina. Peter Lynn continues to have ongoing dyspnea on exertion. Peter Lynn had a mild substernal chest pressure all day yesterday with dyspnea and diaphoresis intermitently. Peter Lynn eventually fall asleep 7:30 PM last night. Peter Lynn woke up around 4 AM this morning with similar feeling however resolved eventually. Peter Lynn again had a substernal chest pressure with dyspnea while push moving loan. His symptoms resolved with rest however reoccurred with activity. Peter Lynn called EMS and brought to ED for further evaluation.  Peter Lynn received aspirin 325 mg. Peter Lynn is chest pain-free currently after sublingual nitroglycerin x 1. EKG shows sinus rhythm with ST depression in lead V3 to V5. No prior EKG to compare. Chest x-ray without acute cardiopulmonary disease. Potassium 3.4. Point-of-care troponin 5.49.   Past Medical History:  Diagnosis Date  . COPD (chronic obstructive pulmonary disease) (HCC)   . Glaucoma     History reviewed. No pertinent surgical history.   Medications Prior to Admission: Prior to Admission medications   Medication Sig Start Date End Date Taking?  Authorizing Provider  cetirizine-pseudoephedrine (ZYRTEC-D) 5-120 MG tablet Take 1 tablet by mouth daily. 03/16/17   Cathie Hoops, Amy V, PA-C  predniSONE (DELTASONE) 50 MG tablet Take 1 pill daily for 5 days. 09/13/16   Charm Rings, MD     Allergies:   No Known Allergies  Social History:   Social History   Social History  . Marital status: Single    Spouse name: N/A  . Number of children: N/A  . Years of education: N/A   Occupational History  . Not on file.   Social History Main Topics  . Smoking status: Never Smoker  . Smokeless tobacco: Never Used  . Alcohol use No  . Drug use: No  . Sexual activity: Not on file   Other Topics Concern  . Not on file   Social History Narrative  . No narrative on file    Family History:   The patient's family history includes Diabetes in his sister.    ROS:  Please see the history of present illness.  All other ROS reviewed and negative.     Physical Exam/Data:   Vitals:   03/27/17 1119  BP: 128/76  Pulse: 61  Resp: 16  Temp: 98.4 F (36.9 C)  TempSrc: Oral  SpO2: 99%   No intake or output data in the 24 hours ending 03/27/17 1238 There were no vitals filed for this visit. There is no height or weight on file to calculate BMI.  General:  Well nourished, well developed, in no acute distress HEENT: normal Lymph: no adenopathy Neck: no JVD Endocrine:  No thryomegaly  Vascular: No carotid bruits; FA pulses 2+ bilaterally without bruits  Cardiac:  normal S1, S2; RRR; no murmur  Lungs:  clear to auscultation bilaterally, no wheezing, rhonchi or rales  Abd: soft, nontender, no hepatomegaly  Ext: no edema Musculoskeletal:  No deformities, BUE and BLE strength normal and equal Skin: warm and dry  Neuro:  CNs 2-12 intact, no focal abnormalities noted Psych:  Normal affect   Laboratory Data:  Chemistry Recent Labs Lab 03/27/17 1121  NA 138  K 3.4*  CL 102  CO2 24  GLUCOSE 99  BUN 10  CREATININE 1.18  CALCIUM 9.3  GFRNONAA  >60  GFRAA >60  ANIONGAP 12    No results for input(s): PROT, ALBUMIN, AST, ALT, ALKPHOS, BILITOT in the last 168 hours. Hematology Recent Labs Lab 03/27/17 1121  WBC 6.3  RBC 4.30  HGB 11.9*  HCT 36.5*  MCV 84.9  MCH 27.7  MCHC 32.6  RDW 13.5  PLT 172   Cardiac EnzymesNo results for input(s): TROPONINI in the last 168 hours.  Recent Labs Lab 03/27/17 1135  TROPIPOC 5.49*    Radiology/Studies:  Dg Chest 2 View  Result Date: 03/27/2017 CLINICAL DATA:  Shortness of breath. EXAM: CHEST  2 VIEW COMPARISON:  None. FINDINGS: The cardiomediastinal silhouette is normal in size. Normal pulmonary vascularity. Atherosclerotic calcification of the aortic arch. No focal consolidation, pleural effusion, or pneumothorax. No acute osseous abnormality. IMPRESSION: 1. No active cardiopulmonary disease. 2.  Aortic Atherosclerosis (ICD10-I70.0). Electronically Signed   By: Obie DredgeWilliam T Derry M.D.   On: 03/27/2017 12:16    Assessment and Plan:   1. NSTEMI - His symptoms is concerning for unstable angina. Ongoing pain since yesterday. Point-of-care troponin 5.49. EKG with ST depression. Will admit. Cycle troponin. Start IV heparin. Plan for cardiac catheterization today. Peter Lynn is NPO.  - The patient understands that risks include but are not limited to stroke (1 in 1000), death (1 in 1000), kidney failure [usually temporary] (1 in 500), bleeding (1 in 200), allergic reaction [possibly serious] (1 in 200), and agrees to proceed.   2. Hypokalemia - Will give K dur 40meq x 1.   Severity of Illness: The appropriate patient status for this patient is observation. Inpatient status is judged to be reasonable and necessary in order to provide the required intensity of service to ensure the patient's safety. The patient's presenting symptoms, physical exam findings, and initial radiographic and laboratory data in the context of their chronic comorbidities is felt to place them at high risk for further clinical  deterioration.  " The patient's presenting symptoms include. Chest pain, dyspnea and diaphoresis " The worrisome physical exam findings include. None " The initial radiographic and laboratory data are worrisome because of. Elevated troponin and abnormal EKg " The chronic co-morbidities include. COPD       Lorelei PontSigned, Bhagat,Bhavinkumar, [place]  03/27/2017 12:38 PM   Patient seen and examined and history reviewed. Agree with above findings and plan. Pleasant 66 yo BM previously healthy presents with classic history of progressive DOE and angina- initially with exertion but now at rest. Peter Lynn has ruled in for MI with positive troponin. Ecg shows anterolateral ST depression c/w NSTEMI. Exam reveals no bruits or JVD. Lungs are clear. CV RRR without gallop or murmur. No edema. Pulses 2+  No known cardiac risk factors. Will check lipid panel and A1c.  Start IV heparin, ASA, beta blocker, statin.  Recommend cardiac cath with possible PCI today. The procedure and risks were reviewed including but  not limited to death, myocardial infarction, stroke, arrythmias, bleeding, transfusion, emergency surgery, dye allergy, or renal dysfunction. The patient voices understanding and is agreeable to proceed.Marland Kitchen  Kathya Wilz Swazil[place], MDFACC 03/27/2017 1:07 PM

## 2017-03-28 ENCOUNTER — Telehealth: Payer: Self-pay | Admitting: Physician Assistant

## 2017-03-28 ENCOUNTER — Encounter (HOSPITAL_COMMUNITY): Payer: Self-pay | Admitting: Cardiology

## 2017-03-28 ENCOUNTER — Other Ambulatory Visit (HOSPITAL_COMMUNITY): Payer: Non-veteran care

## 2017-03-28 ENCOUNTER — Other Ambulatory Visit: Payer: Self-pay | Admitting: Physician Assistant

## 2017-03-28 DIAGNOSIS — E876 Hypokalemia: Secondary | ICD-10-CM | POA: Diagnosis present

## 2017-03-28 DIAGNOSIS — I2510 Atherosclerotic heart disease of native coronary artery without angina pectoris: Secondary | ICD-10-CM | POA: Diagnosis present

## 2017-03-28 DIAGNOSIS — J449 Chronic obstructive pulmonary disease, unspecified: Secondary | ICD-10-CM | POA: Diagnosis present

## 2017-03-28 DIAGNOSIS — Z8249 Family history of ischemic heart disease and other diseases of the circulatory system: Secondary | ICD-10-CM | POA: Diagnosis not present

## 2017-03-28 DIAGNOSIS — I214 Non-ST elevation (NSTEMI) myocardial infarction: Secondary | ICD-10-CM | POA: Diagnosis present

## 2017-03-28 DIAGNOSIS — I700 Atherosclerosis of aorta: Secondary | ICD-10-CM | POA: Diagnosis present

## 2017-03-28 DIAGNOSIS — E785 Hyperlipidemia, unspecified: Secondary | ICD-10-CM | POA: Diagnosis present

## 2017-03-28 DIAGNOSIS — H409 Unspecified glaucoma: Secondary | ICD-10-CM | POA: Diagnosis present

## 2017-03-28 LAB — CBC
HCT: 37.6 % — ABNORMAL LOW (ref 39.0–52.0)
Hemoglobin: 12.2 g/dL — ABNORMAL LOW (ref 13.0–17.0)
MCH: 28 pg (ref 26.0–34.0)
MCHC: 32.4 g/dL (ref 30.0–36.0)
MCV: 86.4 fL (ref 78.0–100.0)
PLATELETS: 168 10*3/uL (ref 150–400)
RBC: 4.35 MIL/uL (ref 4.22–5.81)
RDW: 13.9 % (ref 11.5–15.5)
WBC: 6.8 10*3/uL (ref 4.0–10.5)

## 2017-03-28 LAB — BASIC METABOLIC PANEL
Anion gap: 8 (ref 5–15)
BUN: 9 mg/dL (ref 6–20)
CALCIUM: 8.9 mg/dL (ref 8.9–10.3)
CHLORIDE: 106 mmol/L (ref 101–111)
CO2: 23 mmol/L (ref 22–32)
CREATININE: 1.09 mg/dL (ref 0.61–1.24)
GFR calc non Af Amer: >60 mL/min (ref >60)
Glucose, Bld: 106 mg/dL — ABNORMAL HIGH (ref 65–99)
Potassium: 3.5 mmol/L (ref 3.5–5.1)
Sodium: 137 mmol/L (ref 135–145)

## 2017-03-28 LAB — HEMOGLOBIN A1C
HEMOGLOBIN A1C: 5.9 %
MEAN PLASMA GLUCOSE: 122.63 mg/dL

## 2017-03-28 LAB — TROPONIN I
TROPONIN I: 8.12 ng/mL — AB (ref <0.03)
Troponin I: 7.01 ng/mL (ref <0.03)

## 2017-03-28 MED ORDER — TICAGRELOR 90 MG PO TABS: 90.0000 mg | ORAL_TABLET | Freq: Two times a day (BID) | ORAL | 3 refills | Status: DC

## 2017-03-28 MED ORDER — METOPROLOL SUCCINATE ER 25 MG PO TB24: 25.0000 mg | ORAL_TABLET | Freq: Every day | ORAL | 6 refills | Status: DC

## 2017-03-28 MED ORDER — ATORVASTATIN CALCIUM 80 MG PO TABS: 80.0000 mg | ORAL_TABLET | Freq: Every day | ORAL | 6 refills | Status: DC

## 2017-03-28 MED ORDER — ASPIRIN 81 MG PO TBEC: 81.0000 mg | DELAYED_RELEASE_TABLET | Freq: Every day | ORAL | 11 refills | Status: DC

## 2017-03-28 MED ORDER — METOPROLOL SUCCINATE ER 25 MG PO TB24
25.0000 mg | ORAL_TABLET | Freq: Every day | ORAL | Status: DC
  Administered 2017-03-28: 12:00:00 25 mg via ORAL
  Filled 2017-03-28: qty 1

## 2017-03-28 MED ORDER — NITROGLYCERIN 0.4 MG SL SUBL: 0.4000 mg | SUBLINGUAL_TABLET | SUBLINGUAL | 12 refills | Status: DC | PRN

## 2017-03-28 NOTE — Care Management Note (Addendum)
Case Management Note  Patient Details  Name: Peter RoeJamaal Transue MRN: [id number] Date of Birth: 04/19/1951  Subjective/Objective:  From home alone, uses a cane from time to time, pta indep.  He states his PCP is Velna Hatchetobin Sanders on [address] in [place]. He states [facility] but has not [facility] since [facility], so he does not have an [facility].   NCM awaiting benefit check for brilinta.  [facility] on [address] and they so have brilinta in stock. Per benefit check patient has no drug coverage, NCM gave him the patient ast application to fill out and take to Cardiologist follow up on 8/23, he will need to get samples at follow up or maybe switched to something else what ever MD decides.                  Action/Plan: NCM will follow for dc needs.   Expected Discharge Date:                  Expected Discharge Plan:  Home/Self Care  In-House Referral:     Discharge planning Services  CM Consult  Post Acute Care Choice:    Choice offered to:     DME Arranged:    DME Agency:     HH Arranged:    HH Agency:     Status of Service:  Completed, signed off  If discussed at [employer] Length of Stay Meetings, dates discussed:    Additional Comments:  Leone Havenaylor, Donalda Job Clinton, RN 03/28/2017, 10:07 AM

## 2017-03-28 NOTE — Telephone Encounter (Signed)
Leave message for pt to call back 

## 2017-03-28 NOTE — Discharge Summary (Signed)
Discharge Summary    Patient ID: Peter RoeJamaal Lynn,  MRN: [id number], DOB/AGE: 68/10/1950 68 y.o.  Admit date: 03/27/2017 Discharge date: 03/28/2017  Primary Care Provider: Patient, No Pcp Per Primary Cardiologist: Dr. SwazilandJordan   Discharge Diagnoses    Principal Problem:   NSTEMI (non-ST elevated myocardial infarction) [facility](HCC) Active Problems:   Hyperlipidemia LDL goal <70   Hypokalemia   Allergies No Known Allergies  Diagnostic Studies/Procedures    CORONARY STENT INTERVENTION  LEFT HEART CATH AND CORONARY ANGIOGRAPHY  Conclusion     Mid LAD to Dist LAD lesion, 30 %stenosed.  2nd Diag lesion, 30 %stenosed.  2nd RPLB lesion, 100 %stenosed.  There is mild left ventricular systolic dysfunction.  LV end diastolic pressure is mildly elevated.  The left ventricular ejection fraction is 50-55% by visual estimate.  A STENT RESOLUTE ONYX 4.0X30 drug eluting stent was successfully placed.  Mid RCA lesion, 100 %stenosed.  Post intervention, there is a 0% residual stenosis.  1. 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 The RCA was a very large dominant vessel. Although there were some collaterals the RCA was incompletely filled.  2. Mild LV dysfunction with inferior hypokinesis. 3. Mildly elevated LVEDP 4. Successful stenting of the mid RCA with DES.  Plan: the patient's anatomy was very challenging from the right radial approach. The RCA arises anteriorly and has a downward takeoff. The LCA arises high in the cusp and was very difficult to engage. If Cardiac cath needed in the future I would consider a femoral approach. While there were some collaterals to the RCA I was concerned that these were inadequate and the RCA supplied a very large territory. There was also clearly viability of the inferior wall. Recommend DAPT for one year. Anticipate DC in am if stable.    Diagnostic Diagram       Post-Intervention Diagram           History of  Present Illness     Peter Muhammadis a 66 y.o.malewith no prior Medical history except COPD [facility] for evaluation of chest pain by EMS.  Hospital Course     Consultants: None  1. NSTEMI - presented with symptoms concerning of unstable angina. Peak of troponin 11.71. Cath showed single vessel occlusive CAD with thrombotic mid RCA occlusion s/p PTCA and DES. Although there were some collaterals the RCA was incompletely filled. Recommended femoral approach for future cath. Mild LV dysfunction with inferior hypokinesis. Elevated LVEDP. Ambulated without chest pain. He is planning on enrolling in phase 2 cardiac Rehab - Continue ASA, Brilliinta, statin and BB. Patient has no drug coverage. He will get Brillinta samples by [facility] prior to discharge. Will try to get insurance from his current employer or [facility]. He might need more samples during following up or change to plavix depending on coverage. Personally discussed importance of DAPT. Will give us call if any issue. He would not get 30 days supplies due to lack of insurance.   2. HLD 03/27/2017: Cholesterol 155; HDL 49; LDL Cholesterol 97; Triglycerides 45; VLDL 9. LDL goal less than 70. Continue lipitor 80mg . LFT and lipid panel in 6 weeks.   3. Hypokalemia - Resolved   No TCM appointment was available. Made 1st available.   The patient has been seen by Dr. SwazilandJordan today and deemed ready for discharge home. All follow-up appointments have been scheduled. Discharge medications are listed below.  _____________   Discharge Vitals Blood pressure 113/62, pulse 65,  temperature 98.4 F (36.9 C), temperature source Oral, resp. rate (!) 23, height 5\' 9"  (1.753 m), weight 191 lb 12.8 oz (87 kg), SpO2 97 %.  Filed Weights   03/27/17 1300 03/28/17 0614  Weight: 192 lb (87.1 kg) 191 lb 12.8 oz (87 kg)    Labs & Radiologic Studies     CBC  Recent Labs  03/27/17 1953 03/28/17 0725  WBC 6.6 6.8  HGB 11.9* 12.2*  HCT  36.6* 37.6*  MCV 85.5 86.4  PLT 156 168   Basic Metabolic Panel  Recent Labs  03/27/17 1121 03/27/17 1953 03/28/17 0725  NA 138  --  137  K 3.4*  --  3.5  CL 102  --  106  CO2 24  --  23  GLUCOSE 99  --  106*  BUN 10  --  9  CREATININE 1.18 1.08 1.09  CALCIUM 9.3  --  8.9   Liver Function Tests  Recent Labs  03/27/17 1154  AST 93*  ALT 22  ALKPHOS 38  BILITOT 0.9  PROT 7.0  ALBUMIN 4.0   No results for input(s): LIPASE, AMYLASE in the last 72 hours. Cardiac Enzymes  Recent Labs  03/27/17 1953 03/28/17 0132 03/28/17 0725  TROPONINI 11.71* 8.12* 7.01*   BNP Invalid input(s): POCBNP D-Dimer No results for input(s): DDIMER in the last 72 hours. Hemoglobin A1C  Recent Labs  03/27/17 1953  HGBA1C 5.9   Fasting Lipid Panel  Recent Labs  03/27/17 1953  CHOL 155  HDL 49  LDLCALC 97  TRIG 45  CHOLHDL 3.2   Thyroid Function Tests  Recent Labs  03/27/17 1200  TSH 1.993    Dg Chest 2 View  Result Date: 03/27/2017 CLINICAL DATA:  Shortness of breath. EXAM: CHEST  2 VIEW COMPARISON:  None. FINDINGS: The cardiomediastinal silhouette is normal in size. Normal pulmonary vascularity. Atherosclerotic calcification of the aortic arch. No focal consolidation, pleural effusion, or pneumothorax. No acute osseous abnormality. IMPRESSION: 1. No active cardiopulmonary disease. 2.  Aortic Atherosclerosis (ICD10-I70.0). Electronically Signed   By: Obie Dredge M.D.   On: 03/27/2017 12:16    Disposition   Pt is being discharged home today in good condition.  Follow-up Plans & Appointments    Follow-up Information    [facility]alee Course, [place]. Go on 04/12/2017.   Specialties:  Cardiology, Radiology Why:  @8 :30am for hospital follow up  Contact information: [address] [place] [address] [phone]        [facility] [employer]. Go on 04/04/2017.   Specialty:  Cardiology Why:  @3pm  for echo Contact information: [address] [place] [address] [phone]         Discharge Instructions    AMB Referral to Cardiac Rehabilitation - Phase II    Complete by:  As directed    Diagnosis:  NSTEMI   Amb Referral to Cardiac Rehabilitation    Complete by:  As directed    Diagnosis:   Coronary Stents NSTEMI     Diet - low sodium heart healthy    Complete by:  As directed    Discharge instructions    Complete by:  As directed    NO HEAVY LIFTING (>10lbs) X 2 WEEKS. NO SEXUAL ACTIVITY X 2 WEEKS. NO DRIVING X 1 WEEK. NO SOAKING BATHS, HOT TUBS, POOLS, ETC., X 7 DAYS.   Increase activity slowly    Complete by:  As directed  Discharge Medications   Current Discharge Medication List    START taking these medications   Details  aspirin 81 MG EC tablet Take 1 tablet (81 mg total) by mouth daily. Qty: 30 tablet, Refills: 11    atorvastatin (LIPITOR) 80 MG tablet Take 1 tablet (80 mg total) by mouth daily at 6 PM. Qty: 30 tablet, Refills: 6    metoprolol succinate (TOPROL-XL) 25 MG 24 hr tablet Take 1 tablet (25 mg total) by mouth daily. Qty: 30 tablet, Refills: 6    nitroGLYCERIN (NITROSTAT) 0.4 MG SL tablet Place 1 tablet (0.4 mg total) under the tongue every 5 (five) minutes x 3 doses as needed for chest pain. Qty: 25 tablet, Refills: 12    ticagrelor (BRILINTA) 90 MG TABS tablet Take 1 tablet (90 mg total) by mouth 2 (two) times daily. Qty: 180 tablet, Refills: 3      CONTINUE these medications which have NOT CHANGED   Details  ZYRTEC-D ALLERGY & CONGESTION 5-120 MG tablet Take 1 tablet by mouth daily. Refills: 0        Outstanding Labs/Studies   Echo Consider OP f/u labs 6-8 weeks given statin initiation this admission.  Duration of Discharge Encounter   Greater than 30 minutes including physician time.  Signed, Malley Hauter PA-C 03/28/2017, 1:54 PM

## 2017-03-28 NOTE — Progress Notes (Signed)
CARDIAC REHAB PHASE I   PRE:  Rate/Rhythm: 69 SR  BP:  Sitting: 113/62    MODE:  Ambulation: 400 ft   POST:  Rate/Rhythm: 78 SR  BP:  Sitting: 132/68       Pt ambulated 400 ft on RA, handheld assist, steady gait, tolerated well with no complaints. Completed MI/stent education with pt and relative at bedside.  Reviewed risk factors, MI/PCI book, anti-platelet therapy, stent card, activity restrictions, ntg, exercise, heart healthy diet and phase 2 cardiac rehab. Pt verbalized understanding, receptive to education. Pt agrees to phase 2 cardiac rehab referral, will send to [facility] per pt request. Pt to edge of bed per pt request after walk, call bell within reach.   [phone] Peter GrapesEmily C Syretta Kochel, RN, BSN 03/28/2017 9:50 AM

## 2017-03-28 NOTE — Progress Notes (Signed)
Progress Note  Patient Name: Peter Lynn Date of Encounter: 03/28/2017  Primary Cardiologist: New to Dr. Swaziland  Subjective   Feeling well. No chest pain, sob or palpitations.   Inpatient Medications    Scheduled Meds: . aspirin EC  81 mg Oral Daily  . atorvastatin  80 mg Oral q1800  . enoxaparin (LOVENOX) injection  40 mg Subcutaneous Q24H  . sodium chloride flush  3 mL Intravenous Q12H  . ticagrelor  90 mg Oral BID   Continuous Infusions: . sodium chloride     PRN Meds: sodium chloride, acetaminophen, nitroGLYCERIN, ondansetron (ZOFRAN) IV, sodium chloride flush   Vital Signs    Vitals:   03/27/17 1830 03/27/17 1900 03/27/17 2000 03/28/17 0614  BP: 134/71 123/68 (!) 117/55 (!) 107/57  Pulse:  (!) 59  76  Resp: 19 18 15 13   Temp:  98.3 F (36.8 C)  99.1 F (37.3 C)  TempSrc:  Oral  Oral  SpO2: 98% 99% 99% 98%  Weight:    191 lb 12.8 oz (87 kg)  Height:        Intake/Output Summary (Last 24 hours) at 03/28/17 0700 Last data filed at 03/28/17 0200  Gross per 24 hour  Intake             1021 ml  Output              800 ml  Net              221 ml   Filed Weights   03/27/17 1300 03/28/17 0614  Weight: 192 lb (87.1 kg) 191 lb 12.8 oz (87 kg)    Telemetry    SR rate mostly of 60s - Personally Reviewed  ECG    SR with ST depression anterior laterally with TWI inferiorly  - Personally Reviewed  Physical Exam   GEN: No acute distress.   Neck: No JVD Cardiac: RRR, no murmurs, rubs, or gallops. R radial cath site without hematoma.  Respiratory: Clear to auscultation bilaterally. GI: Soft, nontender, non-distended  MS: No edema; No deformity. Neuro:  Nonfocal  Psych: Normal affect   Labs    Chemistry Recent Labs Lab 03/27/17 1121 03/27/17 1154 03/27/17 1953  NA 138  --   --   K 3.4*  --   --   CL 102  --   --   CO2 24  --   --   GLUCOSE 99  --   --   BUN 10  --   --   CREATININE 1.18  --  1.08  CALCIUM 9.3  --   --   PROT  --  7.0   --   ALBUMIN  --  4.0  --   AST  --  93*  --   ALT  --  22  --   ALKPHOS  --  38  --   BILITOT  --  0.9  --   GFRNONAA >60  --  >60  GFRAA >60  --  >60  ANIONGAP 12  --   --      Hematology Recent Labs Lab 03/27/17 1121 03/27/17 1953  WBC 6.3 6.6  RBC 4.30 4.28  HGB 11.9* 11.9*  HCT 36.5* 36.6*  MCV 84.9 85.5  MCH 27.7 27.8  MCHC 32.6 32.5  RDW 13.5 13.7  PLT 172 156    Cardiac Enzymes Recent Labs Lab 03/27/17 1208 03/27/17 1953 03/28/17 0132  TROPONINI 6.36* 11.71* 8.12*    Recent Labs  Lab 03/27/17 1135  TROPIPOC 5.49*     BNPNo results for input(s): BNP, PROBNP in the last 168 hours.   DDimer No results for input(s): DDIMER in the last 168 hours.   Radiology    Dg Chest 2 View  Result Date: 03/27/2017 CLINICAL DATA:  Shortness of breath. EXAM: CHEST  2 VIEW COMPARISON:  None. FINDINGS: The cardiomediastinal silhouette is normal in size. Normal pulmonary vascularity. Atherosclerotic calcification of the aortic arch. No focal consolidation, pleural effusion, or pneumothorax. No acute osseous abnormality. IMPRESSION: 1. No active cardiopulmonary disease. 2.  Aortic Atherosclerosis (ICD10-I70.0). Electronically Signed   By: Obie Dredge M.D.   On: 03/27/2017 12:16    Cardiac Studies   CORONARY STENT INTERVENTION  LEFT HEART CATH AND CORONARY ANGIOGRAPHY  Conclusion     Mid LAD to Dist LAD lesion, 30 %stenosed.  2nd Diag lesion, 30 %stenosed.  2nd RPLB lesion, 100 %stenosed.  There is mild left ventricular systolic dysfunction.  LV end diastolic pressure is mildly elevated.  The left ventricular ejection fraction is 50-55% by visual estimate.  A STENT RESOLUTE ONYX 4.0X30 drug eluting stent was successfully placed.  Mid RCA lesion, 100 %stenosed.  Post intervention, there is a 0% residual stenosis.   1. 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 The RCA was a very large dominant vessel. Although there were some  collaterals the RCA was incompletely filled.  2. Mild LV dysfunction with inferior hypokinesis. 3. Mildly elevated LVEDP 4. Successful stenting of the mid RCA with DES.  Plan: the patient's anatomy was very challenging from the right radial approach. The RCA arises anteriorly and has a downward takeoff. The LCA arises high in the cusp and was very difficult to engage. If Cardiac cath needed in the future I would consider a femoral approach. While there were some collaterals to the RCA I was concerned that these were inadequate and the RCA supplied a very large territory. There was also clearly viability of the inferior wall. Recommend DAPT for one year. Anticipate DC in am if stable.    Diagnostic Diagram       Post-Intervention Diagram           Patient Profile     Peter Lynn is a 66 y.o. male with no prior Medical history except COPD presented to [facility] ER for evaluation of chest pain by EMS.  Assessment & Plan    1. NSTEMI - presented with symptoms concerning of unstable angina. Peak of troponin 11.71. Cath showed single vessel occlusive CAD with thrombotic mid RCA occlusion s/p PTCA and DES. Although there were some collaterals the RCA was incompletely filled. Recommended femoral approach for future cath. Mild LV dysfunction with inferior hypokinesis. Elevated LVEDP.  - Continue ASA and Brilinta.  Pending echo. Plan to ambulated with cardiac rehab. Consider BB (HR in 60s).   03/27/2017: Cholesterol 155; HDL 49; LDL Cholesterol 97; Triglycerides 45; VLDL 9. LDL goal less than 70. Continue lipitor 80mg . LFT and lipid panel in 6 weeks.    Signed, Manson Passey, PA  03/28/2017, 7:00 AM    Patient seen and examined and history reviewed. Agree with above findings and plan. Feels great. No chest pain. Ambulating in halls. Right wrist without hematoma. Lungs clear. No murmur or gallop.  Ecg shows persistent ST-T depression in inferolateral leads.  Troponin peak  11.71 and trending down.   Patient is stable for DC today. Will add Toprol XL 25 mg daily. DC on ASA, Brilinta,  lipitor. He is planning on enrolling in phase 2 cardiac Rehab. Case management is looking into coverage. Patient may have VA benefits.   Peter Lynn Swazil[place]Jordan, MDFACC 03/28/2017 10:14 AM

## 2017-03-28 NOTE — Telephone Encounter (Signed)
New Message     TCM appt 04/12/17 830a Azalee CourseHao Meng per Georgianne FickVin B

## 2017-03-29 NOTE — Telephone Encounter (Signed)
Patient contacted regarding discharge from 03/27/2017 - 03/28/2017 (28 hours) [facility]  Patient understands to follow up with provider hao on 04-12-[facility]. Patient understands discharge instructions? YES Patient understands medications and regiment? YES Patient understands to bring all medications to this visit? YES  PT UNDERSTANDS ALL D/C INSTRUCTIONS AND WILL COME IN AT 8AM

## 2017-04-04 ENCOUNTER — Other Ambulatory Visit: Payer: Self-pay

## 2017-04-04 ENCOUNTER — Ambulatory Visit (HOSPITAL_COMMUNITY): Payer: POS | Attending: Cardiovascular Disease

## 2017-04-04 DIAGNOSIS — I081 Rheumatic disorders of both mitral and tricuspid valves: Secondary | ICD-10-CM | POA: Insufficient documentation

## 2017-04-04 DIAGNOSIS — I2510 Atherosclerotic heart disease of native coronary artery without angina pectoris: Secondary | ICD-10-CM | POA: Diagnosis not present

## 2017-04-04 DIAGNOSIS — J449 Chronic obstructive pulmonary disease, unspecified: Secondary | ICD-10-CM | POA: Diagnosis not present

## 2017-04-10 ENCOUNTER — Telehealth (HOSPITAL_COMMUNITY): Payer: Self-pay

## 2017-04-10 NOTE — Telephone Encounter (Signed)
Patient insurance is active and benefits verified. Patient insurance is [employer] - no co-payment, deductible $350/$0 has been met, out of pocket $5500/$132.24 has been met, 10% co-insurance and no pre-authorization. Passport/reference [phone].  I also called to confirm benefits due to insurance listed in the system as [place]. Patient has [employer]. Spoke with Tomasa Hosteller- reference #[email or web address] :48am.  Patient will be contacted and scheduled after their follow up appointment with the cardiologist on 04/12/17, upon review by [facility] RN navigator.

## 2017-04-12 ENCOUNTER — Encounter: Payer: Self-pay | Admitting: Physician Assistant

## 2017-04-12 ENCOUNTER — Ambulatory Visit (INDEPENDENT_AMBULATORY_CARE_PROVIDER_SITE_OTHER): Payer: POS | Admitting: Physician Assistant

## 2017-04-12 VITALS — BP 108/70 | HR 52 | Ht 69.0 in | Wt 176.0 lb

## 2017-04-12 DIAGNOSIS — I214 Non-ST elevation (NSTEMI) myocardial infarction: Secondary | ICD-10-CM | POA: Diagnosis not present

## 2017-04-12 DIAGNOSIS — Z79899 Other long term (current) drug therapy: Secondary | ICD-10-CM

## 2017-04-12 DIAGNOSIS — I2510 Atherosclerotic heart disease of native coronary artery without angina pectoris: Secondary | ICD-10-CM | POA: Diagnosis not present

## 2017-04-12 DIAGNOSIS — E785 Hyperlipidemia, unspecified: Secondary | ICD-10-CM

## 2017-04-12 MED ORDER — TICAGRELOR 90 MG PO TABS
90.0000 mg | ORAL_TABLET | Freq: Two times a day (BID) | ORAL | 3 refills | Status: DC
Start: 2017-04-12 — End: 2017-06-15

## 2017-04-12 NOTE — Progress Notes (Signed)
Cardiology Office Note    Date:  04/13/2017   ID:  Peter Lynn, DOB 06-03-1951, MRN [id number]  PCP:  Patient, No Pcp Per  Cardiologist:  Dr. Swaziland   Chief Complaint  Patient presents with  . Follow-up    Post-hopsital visit, no chest pain     History of Present Illness:  Peter Lynn is a 66 y.o. male with no prior medical history presented to hospital on 03/27/2017 with NSTEMI. On arrival, he was hypokalemic with potassium of 3.4. Initial point-of-care troponin was 5.5, troponin eventually peaked at 11.7. Fasting lipid panel showed well-controlled cholesterol, HDL and triglyceride, however LDL was 97. Cardiac catheterization performed on 03/27/2017 showed 100% second RPLB lesion, 100% mid RCA lesion treated with Resolute Onyx 4.0 x 30 mm DES, 30% second diagonal, 50% mid to distal LAD lesion. Echocardiogram obtained on 04/04/2017 showed EF 60-65%, mild MR. Toprol-XL 25 mg daily was added. He is on aspirin, Brilinta and high-dose statin.  He presents today for cardiology office visit. He denies any further chest pain or shortness of breath since he left the hospital. EKG does shows T-wave inversion in inferolateral leads likely evolutionary changes from recent MI. Otherwise, he can start on cardiac rehabilitation. He will need a fasting lipid panel and liver function test in 6-8 weeks. He does not have any lower extremity edema, denies any orthopnea and PND. Overall he is doing quite well from cardiology perspective.    Past Medical History:  Diagnosis Date  . CAD (coronary artery disease)    a. cath 03/27/17 - ingle vessel occlusive CAD with thrombotic mid RCA occlusion s/p PTCA and DES. recomended femoral approach   . COPD (chronic obstructive pulmonary disease) (HCC)   . Glaucoma   . Hyperlipidemia LDL goal <70   . NSTEMI (non-ST elevated myocardial infarction) [facility])     Past Surgical History:  Procedure Laterality Date  . CORONARY STENT INTERVENTION N/A 03/27/2017   Procedure:  CORONARY STENT INTERVENTION;  Surgeon: Swaziland, Peter M, MD;  Location: [facility] INVASIVE CV LAB;  Service: Cardiovascular;  Laterality: N/A;  RCA  . LEFT HEART CATH AND CORONARY ANGIOGRAPHY N/A 03/27/2017   Procedure: LEFT HEART CATH AND CORONARY ANGIOGRAPHY;  Surgeon: Swaziland, Peter M, MD;  Location: [facility] INVASIVE CV LAB;  Service: Cardiovascular;  Laterality: N/A;    Current Medications: Outpatient Medications Prior to Visit  Medication Sig Dispense Refill  . aspirin 81 MG EC tablet Take 1 tablet (81 mg total) by mouth daily. 30 tablet 11  . atorvastatin (LIPITOR) 80 MG tablet Take 1 tablet (80 mg total) by mouth daily at 6 PM. 30 tablet 6  . metoprolol succinate (TOPROL-XL) 25 MG 24 hr tablet Take 1 tablet (25 mg total) by mouth daily. 30 tablet 6  . nitroGLYCERIN (NITROSTAT) 0.4 MG SL tablet Place 1 tablet (0.4 mg total) under the tongue every 5 (five) minutes x 3 doses as needed for chest pain. 25 tablet 12  . ZYRTEC-D ALLERGY & CONGESTION 5-120 MG tablet Take 1 tablet by mouth daily.  0  . ticagrelor (BRILINTA) 90 MG TABS tablet Take 1 tablet (90 mg total) by mouth 2 (two) times daily. 180 tablet 3   No facility-administered medications prior to visit.      Allergies:   Patient has no known allergies.   Social History   Social History  . Marital status: Single    Spouse name: N/A  . Number of children: N/A  . Years of education: N/A   Social  History Main Topics  . Smoking status: Former Games developer  . Smokeless tobacco: Never Used     Comment: QUIT SMOKING OVER 30 YEARS AGO  . Alcohol use No  . Drug use: No  . Sexual activity: Not Asked   Other Topics Concern  . None   Social History Narrative  . None     Family History:  The patient's family history includes Heart attack (age of onset: 15) in his mother.   ROS:   Please see the history of present illness.    ROS All other systems reviewed and are negative.   PHYSICAL EXAM:   VS:  BP 108/70   Pulse (!) 52   Ht 5\' 9"  (1.753  Lynn)   Wt 176 lb (79.8 kg)   BMI 25.99 kg/Lynn    GEN: Well nourished, well developed, in no acute distress  HEENT: normal  Neck: no JVD, carotid bruits, or masses Cardiac: RRR; no murmurs, rubs, or gallops,no edema  Respiratory:  clear to auscultation bilaterally, normal work of breathing GI: soft, nontender, nondistended, + BS MS: no deformity or atrophy  Skin: warm and dry, no rash Neuro:  Alert and Oriented x 3, Strength and sensation are intact Psych: euthymic mood, full affect  Wt Readings from Last 3 Encounters:  04/12/17 176 lb (79.8 kg)  03/28/17 191 lb 12.8 oz (87 kg)  07/18/15 178 lb 12.8 oz (81.1 kg)      Studies/Labs Reviewed:   EKG:  EKG is ordered today.  The ekg ordered today demonstrates Normal sinus rhythm with T-wave inversion in inferolateral leads.  Recent Labs: 03/27/2017: ALT 22; TSH 1.993 03/28/2017: BUN 9; Creatinine, Ser 1.09; Hemoglobin 12.2; Platelets 168; Potassium 3.5; Sodium 137   Lipid Panel    Component Value Date/Time   CHOL 155 03/27/2017 1953   TRIG 45 03/27/2017 1953   HDL 49 03/27/2017 1953   CHOLHDL 3.2 03/27/2017 1953   VLDL 9 03/27/2017 1953   LDLCALC 97 03/27/2017 1953    Additional studies/ records that were reviewed today include:   Cath 03/27/2017 Conclusion     Mid LAD to Dist LAD lesion, 30 %stenosed.  2nd Diag lesion, 30 %stenosed.  2nd RPLB lesion, 100 %stenosed.  There is mild left ventricular systolic dysfunction.  LV end diastolic pressure is mildly elevated.  The left ventricular ejection fraction is 50-55% by visual estimate.  A STENT RESOLUTE ONYX 4.0X30 drug eluting stent was successfully placed.  Mid RCA lesion, 100 %stenosed.  Post intervention, there is a 0% residual stenosis.   1. 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 The RCA was a very large dominant vessel. Although there were some collaterals the RCA was incompletely filled.  2. Mild LV dysfunction with inferior  hypokinesis. 3. Mildly elevated LVEDP 4. Successful stenting of the mid RCA with DES.  Plan: the patient's anatomy was very challenging from the right radial approach. The RCA arises anteriorly and has a downward takeoff. The LCA arises high in the cusp and was very difficult to engage. If Cardiac cath needed in the future I would consider a femoral approach. While there were some collaterals to the RCA I was concerned that these were inadequate and the RCA supplied a very large territory. There was also clearly viability of the inferior wall. Recommend DAPT for one year. Anticipate DC in am if stable.      Echo 04/04/2017 LV EF: 60% -   65%  Study Conclusions  - Left ventricle: The  cavity size was normal. Wall thickness was   increased in a pattern of mild LVH. Systolic function was normal.   The estimated ejection fraction was in the range of 60% to 65%.   Wall motion was normal; there were no regional wall motion   abnormalities. Left ventricular diastolic function parameters   were normal for the patient&'s age. - Mitral valve: There was mild regurgitation. - Right atrium: The atrium was mildly dilated.   ASSESSMENT:    1. NSTEMI (non-ST elevated myocardial infarction) (HCC)   2. Hyperlipidemia LDL goal <70   3. Encounter for long-term (current) use of medications   4. Coronary artery disease involving native coronary artery of native heart without angina pectoris      PLAN:  In order of problems listed above:  1. CAD: recent NSTEMI, treated with DES to mid RCA. We discussed the need to be compliant with aspirin and Brilinta. He denies any recent angina since discharge  2. Hyperlipidemia: LDL 97 recently, currently on high-dose statin with Lipitor 80 mg. Obtain fasting lipid panel in 6-8 weeks    Medication Adjustments/Labs and Tests Ordered: Current medicines are reviewed at length with the patient today.  Concerns regarding medicines are outlined above.  Medication  changes, Labs and Tests ordered today are listed in the Patient Instructions below. Patient Instructions  Medication Instructions:   Continue current medications. No changes.   Labwork:   Return to our office in 6-8 weeks for labwork to check cholesterol.  You do not need an appointment. You will need to fast for this test. Our lab is open from 8 am to 4:30 pm with a closure for lunch from 12:45 to 1:45pm.  Testing/Procedures:  none  Follow-Up:  2-3 months with Dr. Swaziland   If you need a refill on your cardiac medications before your next appointment, please call your pharmacy.      Ramond Dial, [place]  04/13/2017 10:15 PM    [facility] [address] [place], [place], [place]  [address] Phone: [phone]; Fax: [phone]

## 2017-04-12 NOTE — Patient Instructions (Signed)
Medication Instructions:   Continue current medications. No changes.   Labwork:   Return to our office in 6-8 weeks for labwork to check cholesterol.  You do not need an appointment. You will need to fast for this test. Our lab is open from 8 am to 4:30 pm with a closure for lunch from 12:45 to 1:45pm.  Testing/Procedures:  none  Follow-Up:  2-3 months with Dr. Swaziland   If you need a refill on your cardiac medications before your next appointment, please call your pharmacy.

## 2017-04-13 ENCOUNTER — Encounter: Payer: Self-pay | Admitting: Physician Assistant

## 2017-04-13 ENCOUNTER — Telehealth (HOSPITAL_COMMUNITY): Payer: Self-pay

## 2017-04-13 NOTE — Telephone Encounter (Signed)
I called and left message on voicemail to call office about scheduling for cardiac rehab. I left office contact information on patient voicemail to return call.  ° °

## 2017-04-26 ENCOUNTER — Telehealth (HOSPITAL_COMMUNITY): Payer: Self-pay | Admitting: Pharmacy Technician

## 2017-05-02 ENCOUNTER — Telehealth (HOSPITAL_COMMUNITY): Payer: Self-pay

## 2017-05-02 NOTE — Telephone Encounter (Signed)
*  Updated insurance benefits* [employer] - no co-payment, deductible $350/$25.98 has been met, out of pocket $5500/$273.26 has been met, 10% co-insurance and no pre-authorization. Passport/reference [id number].

## 2017-05-03 ENCOUNTER — Encounter (HOSPITAL_COMMUNITY)
Admission: RE | Admit: 2017-05-03 | Discharge: 2017-05-03 | Disposition: A | Discharge status: Home / Self Care | Payer: POS | Source: Ambulatory Visit | Attending: Cardiology | Admitting: Cardiology

## 2017-05-03 ENCOUNTER — Encounter (HOSPITAL_COMMUNITY): Payer: Self-pay

## 2017-05-03 VITALS — BP 110/74 | HR 63 | Ht 69.0 in | Wt 176.8 lb

## 2017-05-03 DIAGNOSIS — Z955 Presence of coronary angioplasty implant and graft: Secondary | ICD-10-CM | POA: Diagnosis not present

## 2017-05-03 DIAGNOSIS — I214 Non-ST elevation (NSTEMI) myocardial infarction: Secondary | ICD-10-CM | POA: Insufficient documentation

## 2017-05-03 DIAGNOSIS — Z48812 Encounter for surgical aftercare following surgery on the circulatory system: Secondary | ICD-10-CM | POA: Diagnosis not present

## 2017-05-03 NOTE — Progress Notes (Signed)
Cardiac Individual Treatment Plan  Patient Details  Name: Peter Lynn MRN: [id number] Date of Birth: Sep 03, 1950 Referring Provider:     CARDIAC REHAB PHASE II ORIENTATION from 05/03/2017 in [facility] CARDIAC REHAB  Referring Provider  [place], Peter MD      Initial Encounter Date:    CARDIAC REHAB PHASE II ORIENTATION from 05/03/2017 in [facility] CARDIAC REHAB  Date  05/03/17  Referring Provider  [place], Peter MD      Visit Diagnosis: NSTEMI (non-ST elevated myocardial infarction) [facility])  Status post coronary artery stent placement  Patient's Home Medications on Admission:  Current Outpatient Prescriptions:  .  aspirin 81 MG EC tablet, Take 1 tablet (81 mg total) by mouth daily., Disp: 30 tablet, Rfl: 11 .  atorvastatin (LIPITOR) 80 MG tablet, Take 1 tablet (80 mg total) by mouth daily at 6 PM., Disp: 30 tablet, Rfl: 6 .  metoprolol succinate (TOPROL-XL) 25 MG 24 hr tablet, Take 1 tablet (25 mg total) by mouth daily., Disp: 30 tablet, Rfl: 6 .  nitroGLYCERIN (NITROSTAT) 0.4 MG SL tablet, Place 1 tablet (0.4 mg total) under the tongue every 5 (five) minutes x 3 doses as needed for chest pain., Disp: 25 tablet, Rfl: 12 .  ticagrelor (BRILINTA) 90 MG TABS tablet, Take 1 tablet (90 mg total) by mouth 2 (two) times daily., Disp: 180 tablet, Rfl: 3 .  ZYRTEC-D ALLERGY & CONGESTION 5-120 MG tablet, Take 1 tablet by mouth daily., Disp: , Rfl: 0  Past Medical History: Past Medical History:  Diagnosis Date  . CAD (coronary artery disease)    a. cath 03/27/17 - ingle vessel occlusive CAD with thrombotic mid RCA occlusion s/p PTCA and DES. recomended femoral approach   . COPD (chronic obstructive pulmonary disease) (HCC)   . Glaucoma   . Hyperlipidemia LDL goal <70   . NSTEMI (non-ST elevated myocardial infarction) (HCC)     Tobacco Use: History  Smoking Status  . Former Smoker  Smokeless Tobacco  . Never Used    Comment: QUIT SMOKING OVER 30  YEARS AGO    Labs: Recent Review Flowsheet Data    Labs for ITP Cardiac and Pulmonary Rehab Latest Ref Rng & Units 03/27/2017   Cholestrol 0 - 200 mg/dL 409   LDLCALC 0 - 99 mg/dL 97   HDL >81 mg/dL 49   Trlycerides <191 mg/dL 45   Hemoglobin Y7W % 5.9      Capillary Blood Glucose: No results found for: GLUCAP   Exercise Target Goals: Date: 05/03/17  Exercise Program Goal: Individual exercise prescription set with THRR, safety & activity barriers. Participant demonstrates ability to understand and report RPE using BORG scale, to self-measure pulse accurately, and to acknowledge the importance of the exercise prescription.  Exercise Prescription Goal: Starting with aerobic activity 30 plus minutes a day, 3 days per week for initial exercise prescription. Provide home exercise prescription and guidelines that participant acknowledges understanding prior to discharge.  Activity Barriers & Risk Stratification:     Activity Barriers & Cardiac Risk Stratification - 05/03/17 1407      Activity Barriers & Cardiac Risk Stratification   Activity Barriers Deconditioning;Muscular Weakness;Other (comment)   Comments occasional knee pain, R   Cardiac Risk Stratification High      6 Minute Walk:     6 Minute Walk    Row Name 05/03/17 1556         6 Minute Walk   Phase Initial     Distance  1734 feet     Walk Time 6 minutes     # of Rest Breaks 0     MPH 3.28     METS 3.89     RPE 11     Symptoms No     Resting HR 63 bpm     Resting BP 110/74     Resting Oxygen Saturation  100 %     Exercise Oxygen Saturation  during 6 min walk 99 %     Max Ex. HR 108 bpm     Max Ex. BP 122/60     2 Minute Post BP 120/78        Oxygen Initial Assessment:   Oxygen Re-Evaluation:   Oxygen Discharge (Final Oxygen Re-Evaluation):   Initial Exercise Prescription:     Initial Exercise Prescription - 05/03/17 1600      Date of Initial Exercise RX and Referring Provider   Date  05/03/17   Referring Provider [place], Peter MD     Bike   Level 0.8   Minutes 15   METs 2.89     NuStep   Level 3   SPM 80   Minutes 15   METs 2     Track   Laps 15   Minutes 15   METs 2.74     Prescription Details   Frequency (times per week) 3   Duration Progress to 30 minutes of continuous aerobic without signs/symptoms of physical distress     Intensity   THRR 40-80% of Max Heartrate 61-122   Ratings of Perceived Exertion 11-13   Perceived Dyspnea 0-4     Progression   Progression Continue to progress workloads to maintain intensity without signs/symptoms of physical distress.     Resistance Training   Training Prescription Yes   Weight 3lbs   Reps 10-15      Perform Capillary Blood Glucose checks as needed.  Exercise Prescription Changes:   Exercise Comments:   Exercise Goals and Review:      Exercise Goals    Row Name 05/03/17 1413             Exercise Goals   Increase Physical Activity Yes  return to yardwork       Intervention Provide advice, education, support and counseling about physical activity/exercise needs.;Develop an individualized exercise prescription for aerobic and resistive training based on initial evaluation findings, risk stratification, comorbidities and participant's personal goals.       Expected Outcomes Achievement of increased cardiorespiratory fitness and enhanced flexibility, muscular endurance and strength shown through measurements of functional capacity and personal statement of participant.       Increase Strength and Stamina Yes  learn activity/exercise limitations       Intervention Provide advice, education, support and counseling about physical activity/exercise needs.;Develop an individualized exercise prescription for aerobic and resistive training based on initial evaluation findings, risk stratification, comorbidities and participant's personal goals.       Expected Outcomes Achievement of increased  cardiorespiratory fitness and enhanced flexibility, muscular endurance and strength shown through measurements of functional capacity and personal statement of participant.       Able to understand and use rate of perceived exertion (RPE) scale Yes       Intervention Provide education and explanation on how to use RPE scale       Expected Outcomes Short Term: Able to use RPE daily in rehab to express subjective intensity level;Long Term:  Able to use RPE to guide intensity  level when exercising independently       Knowledge and understanding of Target Heart Rate Range (THRR) Yes       Intervention Provide education and explanation of THRR including how the numbers were predicted and where they are located for reference       Expected Outcomes Short Term: Able to state/look up THRR;Long Term: Able to use THRR to govern intensity when exercising independently;Short Term: Able to use daily as guideline for intensity in rehab       Able to check pulse independently Yes       Intervention Provide education and demonstration on how to check pulse in carotid and radial arteries.;Review the importance of being able to check your own pulse for safety during independent exercise       Expected Outcomes Short Term: Able to explain why pulse checking is important during independent exercise;Long Term: Able to check pulse independently and accurately       Understanding of Exercise Prescription Yes       Intervention Provide education, explanation, and written materials on patient's individual exercise prescription       Expected Outcomes Short Term: Able to explain program exercise prescription;Long Term: Able to explain home exercise prescription to exercise independently          Exercise Goals Re-Evaluation :    Discharge Exercise Prescription (Final Exercise Prescription Changes):   Nutrition:  Target Goals: Understanding of nutrition guidelines, daily intake of sodium 1500mg , cholesterol 200mg ,  calories 30% from fat and 7% or less from saturated fats, daily to have 5 or more servings of fruits and vegetables.  Biometrics:     Pre Biometrics - 05/03/17 1658      Pre Biometrics   Height  (1.753 m)   Weight 176 lb 12.9 oz (80.2 kg)   Waist Circumference 35 inches   Hip Circumference 37.5 inches   Waist to Hip Ratio 0.93 %   BMI (Calculated) 26.1   Triceps Skinfold 20 mm   % Body Fat 25.7 %   Grip Strength 42 kg   Flexibility 14 in   Single Leg Stand 7 seconds       Nutrition Therapy Plan and Nutrition Goals:     Nutrition Therapy & Goals - 05/03/17 1542      Nutrition Therapy   Diet Therapeutic Lifestyle Changes     Personal Nutrition Goals   Nutrition Goal Pt to identify food quantities necessary to achieve weight loss of 6-15 lb at graduation from cardiac rehab.      Intervention Plan   Intervention Prescribe, educate and counsel regarding individualized specific dietary modifications aiming towards targeted core components such as weight, hypertension, lipid management, diabetes, heart failure and other comorbidities.   Expected Outcomes Short Term Goal: Understand basic principles of dietary content, such as calories, fat, sodium, cholesterol and nutrients.;Long Term Goal: Adherence to prescribed nutrition plan.      Nutrition Discharge: Nutrition Scores:     Nutrition Assessments - 05/03/17 1542      MEDFICTS Scores   Pre Score 30      Nutrition Goals Re-Evaluation:   Nutrition Goals Re-Evaluation:   Nutrition Goals Discharge (Final Nutrition Goals Re-Evaluation):   Psychosocial: Target Goals: Acknowledge presence or absence of significant depression and/or stress, maximize coping skills, provide positive support system. Participant is able to verbalize types and ability to use techniques and skills needed for reducing stress and depression.  Initial Review & Psychosocial Screening:     Initial  Psych Review & Screening - 05/03/17 1654       Initial Review   Current issues with None Identified     Family Dynamics   Good Support System? Yes     Barriers   Psychosocial barriers to participate in program There are no identifiable barriers or psychosocial needs.     Screening Interventions   Interventions Encouraged to exercise      Quality of Life Scores:     Quality of Life - 05/03/17 1616      Quality of Life Scores   Health/Function Pre 28.33 %   Socioeconomic Pre 28.13 %   Psych/Spiritual Pre 30 %   Family Pre 27 %   GLOBAL Pre 28.43 %      PHQ-9: Recent Review Flowsheet Data    Depression screen [facility] 2/9 07/18/2015   Decreased Interest 0   Down, Depressed, Hopeless 0   PHQ - 2 Score 0     Interpretation of Total Score  Total Score Depression Severity:  1-4 = Minimal depression, 5-9 = Mild depression, 10-14 = Moderate depression, 15-19 = Moderately severe depression, 20-27 = Severe depression   Psychosocial Evaluation and Intervention:   Psychosocial Re-Evaluation:   Psychosocial Discharge (Final Psychosocial Re-Evaluation):   Vocational Rehabilitation: Provide vocational rehab assistance to qualifying candidates.   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 Participant will state understanding/return demonstration of topics presented.  Learning Barriers/Preferences:     Learning Barriers/Preferences - 05/03/17 1406      Learning Barriers/Preferences   Learning Barriers Sight   Learning Preferences Written Material;Video;Pictoral      Education Topics: Count Your Pulse:  -Group instruction provided by verbal instruction, demonstration, patient participation and  written materials to support subject.  Instructors address importance of being able to find your pulse and how to count your pulse when at home without a heart monitor.  Patients get hands on experience counting their pulse with staff help and individually.   Heart Attack, Angina, and Risk Factor Modification:  -Group instruction provided by verbal instruction, video, and written materials to support subject.  Instructors address signs and symptoms of angina and heart attacks.    Also discuss risk factors for heart disease and how to make changes to improve heart health risk factors.   Functional Fitness:  -Group instruction provided by verbal instruction, demonstration, patient participation, and written materials to support subject.  Instructors address safety measures for doing things around the house.  Discuss how to get up and down off the floor, how to pick things up properly, how to safely get out of a chair without assistance, and balance training.   Meditation and Mindfulness:  -Group instruction provided by verbal instruction, patient participation, and written materials to support subject.  Instructor addresses importance of mindfulness and meditation practice to help reduce stress and improve awareness.  Instructor also leads participants through a meditation exercise.    Stretching for Flexibility and Mobility:  -Group instruction provided by verbal instruction, patient participation, and written materials to support subject.  Instructors lead participants through series of stretches that are designed to increase flexibility thus improving mobility.  These stretches are additional exercise for major  muscle groups that are typically performed during regular warm up and cool down.   Hands Only CPR:  -Group verbal, video, and participation provides a basic overview of AHA guidelines for community CPR. Role-play of emergencies allow participants the opportunity to practice calling for  help and chest compression technique with discussion of AED use.   Hypertension: -Group verbal and written instruction that provides a basic overview of hypertension including the most recent diagnostic guidelines, risk factor reduction with self-care instructions and medication management.    Nutrition I class: Heart Healthy Eating:  -Group instruction provided by PowerPoint slides, verbal discussion, and written materials to support subject matter. The instructor gives an explanation and review of the Therapeutic Lifestyle Changes diet recommendations, which includes a discussion on lipid goals, dietary fat, sodium, fiber, plant stanol/sterol esters, sugar, and the components of a well-balanced, healthy diet.   Nutrition II class: Lifestyle Skills:  -Group instruction provided by PowerPoint slides, verbal discussion, and written materials to support subject matter. The instructor gives an explanation and review of label reading, grocery shopping for heart health, heart healthy recipe modifications, and ways to make healthier choices when eating out.   Diabetes Question & Answer:  -Group instruction provided by PowerPoint slides, verbal discussion, and written materials to support subject matter. The instructor gives an explanation and review of diabetes co-morbidities, pre- and post-prandial blood glucose goals, pre-exercise blood glucose goals, signs, symptoms, and treatment of hypoglycemia and hyperglycemia, and foot care basics.   Diabetes Blitz:  -Group instruction provided by PowerPoint slides, verbal discussion, and written materials to support subject matter. The instructor gives an explanation and review of the physiology behind type 1 and type 2 diabetes, diabetes medications and rational behind using different medications, pre- and post-prandial blood glucose recommendations and Hemoglobin A1c goals, diabetes diet, and exercise including blood glucose guidelines for exercising  safely.    Portion Distortion:  -Group instruction provided by PowerPoint slides, verbal discussion, written materials, and food models to support subject matter. The instructor gives an explanation of serving size versus portion size, changes in portions sizes over the last 20 years, and what consists of a serving from each food group.   Stress Management:  -Group instruction provided by verbal instruction, video, and written materials to support subject matter.  Instructors review role of stress in heart disease and how to cope with stress positively.     Exercising on Your Own:  -Group instruction provided by verbal instruction, power point, and written materials to support subject.  Instructors discuss benefits of exercise, components of exercise, frequency and intensity of exercise, and end points for exercise.  Also discuss use of nitroglycerin and activating EMS.  Review options of places to exercise outside of rehab.  Review guidelines for sex with heart disease.   Cardiac Drugs I:  -Group instruction provided by verbal instruction and written materials to support subject.  Instructor reviews cardiac drug classes: antiplatelets, anticoagulants, beta blockers, and statins.  Instructor discusses reasons, side effects, and lifestyle considerations for each drug class.   Cardiac Drugs II:  -Group instruction provided by verbal instruction and written materials to support subject.  Instructor reviews cardiac drug classes: angiotensin converting enzyme inhibitors (ACE-I), angiotensin II receptor blockers (ARBs), nitrates, and calcium channel blockers.  Instructor discusses reasons, side effects, and lifestyle considerations for each drug class.   Anatomy and Physiology of the Circulatory System:  Group verbal and written instruction and models provide basic cardiac anatomy and physiology, with the coronary electrical and  arterial systems. Review of: AMI, Angina, Valve disease, Heart  Failure, Peripheral Artery Disease, Cardiac Arrhythmia, Pacemakers, and the ICD.   Other Education:  -Group or individual verbal, written, or video instructions that support the educational goals of the cardiac rehab program.   Knowledge Questionnaire Score:     Knowledge Questionnaire Score - 05/03/17 1556      Knowledge Questionnaire Score   Pre Score 22/24      Core Components/Risk Factors/Patient Goals at Admission:     Personal Goals and Risk Factors at Admission - 05/03/17 1615      Core Components/Risk Factors/Patient Goals on Admission   Lipids Yes   Intervention Provide education and support for participant on nutrition & aerobic/resistive exercise along with prescribed medications to achieve LDL 70mg , HDL >40mg .   Expected Outcomes Short Term: Participant states understanding of desired cholesterol values and is compliant with medications prescribed. Participant is following exercise prescription and nutrition guidelines.;Long Term: Cholesterol controlled with medications as prescribed, with individualized exercise RX and with personalized nutrition plan. Value goals: LDL < , HDL > 40 mg.      Core Components/Risk Factors/Patient Goals Review:    Core Components/Risk Factors/Patient Goals at Discharge (Final Review):    ITP Comments:     ITP Comments    Row Name 05/03/17 1403           ITP Comments Medical Director, Dr. Armanda Magic          Comments: Peter Lynn attended orientation from 1330 to 1530 to review rules and guidelines for program. Completed 6 minute walk test, Intitial ITP, and exercise prescription.  VSS. Telemetry-Sinus Rhythm with inverted Twave. This has been previously documented.  Asymptomatic.Gladstone Lighter, RN,BSN 05/03/2017 5:09 PM

## 2017-05-03 NOTE — Progress Notes (Signed)
.  crph

## 2017-05-03 NOTE — Progress Notes (Signed)
Peter Lynn 66 y.o. male DOB: 01/22/1951 MRN: [id number]      Nutrition Note  Dx: NSTEMI Past Medical History:  Diagnosis Date  . CAD (coronary artery disease)    a. cath 03/27/17 - ingle vessel occlusive CAD with thrombotic mid RCA occlusion s/p PTCA and DES. recomended femoral approach   . COPD (chronic obstructive pulmonary disease) (HCC)   . Glaucoma   . Hyperlipidemia LDL goal <70   . NSTEMI (non-ST elevated myocardial infarction) (HCC)    Meds reviewed.   HT: Ht Readings from Last 1 Encounters:  04/12/17 5\' 9"  (1.753 m)    WT: Wt Readings from Last 3 Encounters:  04/12/17 176 lb (79.8 kg)  03/28/17 191 lb 12.8 oz (87 kg)  07/18/15 178 lb 12.8 oz (81.1 kg)     BMI 26.1   Current tobacco use? No  Labs:  Lipid Panel     Component Value Date/Time   CHOL 155 03/27/2017 1953   TRIG 45 03/27/2017 1953   HDL 49 03/27/2017 1953   CHOLHDL 3.2 03/27/2017 1953   VLDL 9 03/27/2017 1953   LDLCALC 97 03/27/2017 1953    Lab Results  Component Value Date   HGBA1C 5.9 03/27/2017   CBG (last 3)  No results for input(s): GLUCAP in the last 72 hours.  Nutrition Note Spoke with pt. Nutrition plan and goals reviewed with pt. Pt is following Step 2 of the Therapeutic Lifestyle Changes diet. Pt is a lacto-ovo-pescatarian. Pt wants to lose "some" wt. Wt loss tips reviewed. Pt is pre-diabetic according to his last A1c. Pt expressed understanding of the information reviewed. Pt aware of nutrition education classes offered and  plans on attending nutrition classes.  Nutrition Diagnosis ? Food-and nutrition-related knowledge deficit related to lack of exposure to information as related to diagnosis of: ? CVD ? Pre-DM ? Overweight related to excessive energy intake as evidenced by a BMI of 26.1  Nutrition Intervention ? Pt's individual nutrition plan and goals reviewed with pt.  Nutrition Goal(s):  ? Pt to identify food quantities necessary to achieve weight loss of 6-15 lb at  graduation from cardiac rehab.   Plan:  Pt to attend nutrition classes ? Nutrition I ? Nutrition II ? Portion Distortion  Will provide client-centered nutrition education as part of interdisciplinary care.   Monitor and evaluate progress toward nutrition goal with team.  Mickle PlumbEdna Kathalina Ostermann, M.Ed, RD, LDN, CDE 05/03/2017 1:47 PM

## 2017-05-03 NOTE — Progress Notes (Signed)
Cardiac Rehab Medication Review by a Pharmacist  Does the patient  feel that his/her medications are working for him/her?  {YES   Has the patient been experiencing any side effects to the medications prescribed?  no  Does the patient measure his/her own blood pressure or blood glucose at home?  yes   Does the patient have any problems obtaining medications due to transportation or finances?   no  Understanding of regimen: excellent Understanding of indications: excellent Potential of compliance: excellent    Nurse Comments: Peter FlockJamaal takes his medications including Brillinta as prescribed as well as his other medications.    Peter BruceMaria Walden [facility] Center[facility]aker 05/03/2017 3:25 PM

## 2017-05-07 ENCOUNTER — Encounter (HOSPITAL_COMMUNITY)
Admission: RE | Admit: 2017-05-07 | Discharge: 2017-05-07 | Disposition: A | Discharge status: Home / Self Care | Payer: POS | Source: Ambulatory Visit | Attending: Cardiology | Admitting: Cardiology

## 2017-05-07 ENCOUNTER — Encounter (HOSPITAL_COMMUNITY): Payer: Self-pay

## 2017-05-07 DIAGNOSIS — I214 Non-ST elevation (NSTEMI) myocardial infarction: Secondary | ICD-10-CM

## 2017-05-07 DIAGNOSIS — Z955 Presence of coronary angioplasty implant and graft: Secondary | ICD-10-CM

## 2017-05-07 NOTE — Progress Notes (Signed)
Daily Session Note  Patient Details  Name: Peter Lynn MRN: [id number] Date of Birth: 30-Mar-1951 Referring Provider:     CARDIAC REHAB PHASE II ORIENTATION from 05/03/2017 in [place]  Referring Provider  [place], Peter MD      Encounter Date: 05/07/2017  Check In:     Session Check In - 05/07/17 1407      Check-In   Location [facility]   Staff Present Maurice Small, RN, BSN;Twanda Stakes, RN, Marga Melnick, RN, BSN   Supervising physician immediately available to respond to emergencies [facility] immediately available   Physician(s) Dr. Carolin Sicks   Medication changes reported     No   Fall or balance concerns reported    No   Tobacco Cessation No Change   Warm-up and Cool-down Performed as group-led instruction   Resistance Training Performed Yes   VAD Patient? No     Pain Assessment   Currently in Pain? No/denies      Capillary Blood Glucose: No results found for this or any previous visit (from the past 24 hour(s)).    History  Smoking Status  . Former Smoker  Smokeless Tobacco  . Never Used    Comment: QUIT SMOKING OVER 30 YEARS AGO    Goals Met:  Exercise tolerated well  Goals Unmet:  Not Applicable  Comments: Pt started cardiac rehab today.  Pt tolerated light exercise without difficulty. VSS, telemetry-sinus rhythm, asymptomatic.  Medication list reconciled. Pt denies barriers to medicaiton compliance.  PSYCHOSOCIAL ASSESSMENT:  PHQ-0. Pt exhibits positive coping skills, hopeful outlook with supportive family. No psychosocial needs identified at this time, no psychosocial interventions necessary.    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     Pt oriented to exercise equipment and routine.    Understanding verbalized.   Dr. Fransico Him is Medical  Director for Cardiac Rehab at [facility].

## 2017-05-09 ENCOUNTER — Encounter (HOSPITAL_COMMUNITY)
Admission: RE | Admit: 2017-05-09 | Discharge: 2017-05-09 | Disposition: A | Discharge status: Home / Self Care | Payer: POS | Source: Ambulatory Visit | Attending: Cardiology | Admitting: Cardiology

## 2017-05-09 DIAGNOSIS — I214 Non-ST elevation (NSTEMI) myocardial infarction: Secondary | ICD-10-CM

## 2017-05-09 DIAGNOSIS — Z955 Presence of coronary angioplasty implant and graft: Secondary | ICD-10-CM

## 2017-05-14 ENCOUNTER — Encounter (HOSPITAL_COMMUNITY)
Admission: RE | Admit: 2017-05-14 | Discharge: 2017-05-14 | Disposition: A | Discharge status: Home / Self Care | Payer: POS | Source: Ambulatory Visit | Attending: Cardiology | Admitting: Cardiology

## 2017-05-14 DIAGNOSIS — Z955 Presence of coronary angioplasty implant and graft: Secondary | ICD-10-CM

## 2017-05-14 DIAGNOSIS — I214 Non-ST elevation (NSTEMI) myocardial infarction: Secondary | ICD-10-CM

## 2017-05-16 ENCOUNTER — Encounter (HOSPITAL_COMMUNITY)
Admission: RE | Admit: 2017-05-16 | Discharge: 2017-05-16 | Disposition: A | Discharge status: Home / Self Care | Payer: POS | Source: Ambulatory Visit | Attending: Cardiology | Admitting: Cardiology

## 2017-05-16 DIAGNOSIS — I214 Non-ST elevation (NSTEMI) myocardial infarction: Secondary | ICD-10-CM

## 2017-05-16 DIAGNOSIS — Z955 Presence of coronary angioplasty implant and graft: Secondary | ICD-10-CM

## 2017-05-16 NOTE — Progress Notes (Signed)
Reviewed home exercise with pt today.  Pt plans to walk, 2(15') bouts for exercise.  Reviewed THR, pulse, RPE, sign and symptoms, NTG use, and when to call 911 or MD.  Also discussed weather considerations and indoor options.  Pt voiced understanding.    Peter Lynn [employer]

## 2017-05-21 ENCOUNTER — Encounter (HOSPITAL_COMMUNITY)
Admission: RE | Admit: 2017-05-21 | Discharge: 2017-05-21 | Disposition: A | Discharge status: Home / Self Care | Payer: POS | Source: Ambulatory Visit | Attending: Cardiology | Admitting: Cardiology

## 2017-05-21 DIAGNOSIS — Z955 Presence of coronary angioplasty implant and graft: Secondary | ICD-10-CM | POA: Insufficient documentation

## 2017-05-21 DIAGNOSIS — I214 Non-ST elevation (NSTEMI) myocardial infarction: Secondary | ICD-10-CM

## 2017-05-21 DIAGNOSIS — Z48812 Encounter for surgical aftercare following surgery on the circulatory system: Secondary | ICD-10-CM | POA: Insufficient documentation

## 2017-05-21 NOTE — Progress Notes (Signed)
Peter Lynn 66 y.o. male DOB: 06-Mar-1951 MRN: [id number]      Nutrition Note  Dx: NSTEMI  Note Spoke with pt. Nutrition plan and survey reviewed with pt. Pt is following Step 2 of the Therapeutic Lifestyle Changes diet. Pt is a lacto-ovo-pescatarian. Pt wants to lose wt, but is not actively trying to lose wt.Pt is pre-diabetic according to his last A1c. Pre-diabetes discussed. Pt expressed understanding of the information reviewed. Pt aware of nutrition education classes offered and  plans on attending nutrition classes.  Nutrition Diagnosis ? Food-and nutrition-related knowledge deficit related to lack of exposure to information as related to diagnosis of: ? CVD ? Pre-DM ? Overweight related to excessive energy intake as evidenced by a BMI of 26.1  Nutrition Intervention ? Pt's individual nutrition plan and goals reviewed with pt. ? Benefits of adopting Therapeutic Lifestyle Changes discussed when Med[employer] reviewed.   ? Pt given handouts for:? pre-diabetes  Nutrition Goal(s):  ? Pt to identify food quantities necessary to achieve weight loss of 6-15 lb at graduation from cardiac rehab.   Plan:  Pt to attend nutrition classes ? Nutrition I ? Nutrition II ? Portion Distortion  Will provide client-centered nutrition education as part of interdisciplinary care.   Monitor and evaluate progress toward nutrition goal with team.  Mickle Plumb, M.Ed, RD, LDN, CDE 05/21/2017 2:07 PM

## 2017-05-23 ENCOUNTER — Encounter (HOSPITAL_COMMUNITY)
Admission: RE | Admit: 2017-05-23 | Discharge: 2017-05-23 | Disposition: A | Discharge status: Home / Self Care | Payer: POS | Source: Ambulatory Visit | Attending: Cardiology | Admitting: Cardiology

## 2017-05-23 DIAGNOSIS — Z955 Presence of coronary angioplasty implant and graft: Secondary | ICD-10-CM

## 2017-05-23 DIAGNOSIS — I214 Non-ST elevation (NSTEMI) myocardial infarction: Secondary | ICD-10-CM

## 2017-05-28 ENCOUNTER — Encounter (HOSPITAL_COMMUNITY)
Admission: RE | Admit: 2017-05-28 | Discharge: 2017-05-28 | Disposition: A | Discharge status: Home / Self Care | Payer: POS | Source: Ambulatory Visit | Attending: Cardiology | Admitting: Cardiology

## 2017-05-28 VITALS — Ht 69.0 in | Wt 176.8 lb

## 2017-05-28 DIAGNOSIS — Z955 Presence of coronary angioplasty implant and graft: Secondary | ICD-10-CM

## 2017-05-28 DIAGNOSIS — I214 Non-ST elevation (NSTEMI) myocardial infarction: Secondary | ICD-10-CM

## 2017-05-29 ENCOUNTER — Encounter (HOSPITAL_COMMUNITY): Payer: Self-pay

## 2017-05-29 NOTE — Progress Notes (Deleted)
Cardiac Individual Treatment Plan  Patient Details  Name: Peter Lynn MRN: [id number] Date of Birth: 1950-11-02 Referring Provider:     CARDIAC REHAB PHASE II ORIENTATION from 05/03/2017 in [facility] CARDIAC REHAB  Referring Provider  [place], Peter MD      Initial Encounter Date:    CARDIAC REHAB PHASE II ORIENTATION from 05/03/2017 in [facility] CARDIAC REHAB  Date  05/03/17  Referring Provider  [place], Peter MD      Visit Diagnosis: Status post coronary artery stent placement  NSTEMI (non-ST elevated myocardial infarction) [facility])  Patient's Home Medications on Admission:  Current Outpatient Prescriptions:  .  aspirin 81 MG EC tablet, Take 1 tablet (81 mg total) by mouth daily., Disp: 30 tablet, Rfl: 11 .  atorvastatin (LIPITOR) 80 MG tablet, Take 1 tablet (80 mg total) by mouth daily at 6 PM., Disp: 30 tablet, Rfl: 6 .  metoprolol succinate (TOPROL-XL) 25 MG 24 hr tablet, Take 1 tablet (25 mg total) by mouth daily., Disp: 30 tablet, Rfl: 6 .  nitroGLYCERIN (NITROSTAT) 0.4 MG SL tablet, Place 1 tablet (0.4 mg total) under the tongue every 5 (five) minutes x 3 doses as needed for chest pain., Disp: 25 tablet, Rfl: 12 .  ticagrelor (BRILINTA) 90 MG TABS tablet, Take 1 tablet (90 mg total) by mouth 2 (two) times daily., Disp: 180 tablet, Rfl: 3 .  ZYRTEC-D ALLERGY & CONGESTION 5-120 MG tablet, Take 1 tablet by mouth daily., Disp: , Rfl: 0  Past Medical History: Past Medical History:  Diagnosis Date  . CAD (coronary artery disease)    a. cath 03/27/17 - ingle vessel occlusive CAD with thrombotic mid RCA occlusion s/p PTCA and DES. recomended femoral approach   . COPD (chronic obstructive pulmonary disease) (HCC)   . Glaucoma   . Hyperlipidemia LDL goal <70   . NSTEMI (non-ST elevated myocardial infarction) (HCC)     Tobacco Use: History  Smoking Status  . Former Smoker  Smokeless Tobacco  . Never Used    Comment: QUIT SMOKING OVER 30  YEARS AGO    Labs: Recent Review Flowsheet Data    Labs for ITP Cardiac and Pulmonary Rehab Latest Ref Rng & Units 03/27/2017   Cholestrol 0 - 200 mg/dL 782   LDLCALC 0 - 99 mg/dL 97   HDL >95 mg/dL 49   Trlycerides <621 mg/dL 45   Hemoglobin H0Q % 5.9      Capillary Blood Glucose: No results found for: GLUCAP   Exercise Target Goals:    Exercise Program Goal: Individual exercise prescription set with THRR, safety & activity barriers. Participant demonstrates ability to understand and report RPE using BORG scale, to self-measure pulse accurately, and to acknowledge the importance of the exercise prescription.  Exercise Prescription Goal: Starting with aerobic activity 30 plus minutes a day, 3 days per week for initial exercise prescription. Provide home exercise prescription and guidelines that participant acknowledges understanding prior to discharge.  Activity Barriers & Risk Stratification:     Activity Barriers & Cardiac Risk Stratification - 05/03/17 1407      Activity Barriers & Cardiac Risk Stratification   Activity Barriers Deconditioning;Muscular Weakness;Other (comment)   Comments occasional knee pain, R   Cardiac Risk Stratification High      6 Minute Walk:     6 Minute Walk    Row Name 05/03/17 1556         6 Minute Walk   Phase Initial     Distance  1734 feet     Walk Time 6 minutes     # of Rest Breaks 0     MPH 3.28     METS 3.89     RPE 11     Symptoms No     Resting HR 63 bpm     Resting BP 110/74     Resting Oxygen Saturation  100 %     Exercise Oxygen Saturation  during 6 min walk 99 %     Max Ex. HR 108 bpm     Max Ex. BP 122/60     2 Minute Post BP 120/78        Oxygen Initial Assessment:   Oxygen Re-Evaluation:   Oxygen Discharge (Final Oxygen Re-Evaluation):   Initial Exercise Prescription:     Initial Exercise Prescription - 05/03/17 1600      Date of Initial Exercise RX and Referring Provider   Date 05/03/17    Referring Provider [place], Peter MD     Bike   Level 0.8   Minutes 15   METs 2.89     NuStep   Level 3   SPM 80   Minutes 15   METs 2     Track   Laps 15   Minutes 15   METs 2.74     Prescription Details   Frequency (times per week) 3   Duration Progress to 30 minutes of continuous aerobic without signs/symptoms of physical distress     Intensity   THRR 40-80% of Max Heartrate 61-122   Ratings of Perceived Exertion 11-13   Perceived Dyspnea 0-4     Progression   Progression Continue to progress workloads to maintain intensity without signs/symptoms of physical distress.     Resistance Training   Training Prescription Yes   Weight 3lbs   Reps 10-15      Perform Capillary Blood Glucose checks as needed.  Exercise Prescription Changes:   Exercise Comments:   Exercise Goals and Review:     Exercise Goals    Row Name 05/03/17 1413             Exercise Goals   Increase Physical Activity Yes  return to yardwork       Intervention Provide advice, education, support and counseling about physical activity/exercise needs.;Develop an individualized exercise prescription for aerobic and resistive training based on initial evaluation findings, risk stratification, comorbidities and participant's personal goals.       Expected Outcomes Achievement of increased cardiorespiratory fitness and enhanced flexibility, muscular endurance and strength shown through measurements of functional capacity and personal statement of participant.       Increase Strength and Stamina Yes  learn activity/exercise limitations       Intervention Provide advice, education, support and counseling about physical activity/exercise needs.;Develop an individualized exercise prescription for aerobic and resistive training based on initial evaluation findings, risk stratification, comorbidities and participant's personal goals.       Expected Outcomes Achievement of increased cardiorespiratory  fitness and enhanced flexibility, muscular endurance and strength shown through measurements of functional capacity and personal statement of participant.       Able to understand and use rate of perceived exertion (RPE) scale Yes       Intervention Provide education and explanation on how to use RPE scale       Expected Outcomes Short Term: Able to use RPE daily in rehab to express subjective intensity level;Long Term:  Able to use RPE to guide intensity level  when exercising independently       Knowledge and understanding of Target Heart Rate Range (THRR) Yes       Intervention Provide education and explanation of THRR including how the numbers were predicted and where they are located for reference       Expected Outcomes Short Term: Able to state/look up THRR;Long Term: Able to use THRR to govern intensity when exercising independently;Short Term: Able to use daily as guideline for intensity in rehab       Able to check pulse independently Yes       Intervention Provide education and demonstration on how to check pulse in carotid and radial arteries.;Review the importance of being able to check your own pulse for safety during independent exercise       Expected Outcomes Short Term: Able to explain why pulse checking is important during independent exercise;Long Term: Able to check pulse independently and accurately       Understanding of Exercise Prescription Yes       Intervention Provide education, explanation, and written materials on patient's individual exercise prescription       Expected Outcomes Short Term: Able to explain program exercise prescription;Long Term: Able to explain home exercise prescription to exercise independently          Exercise Goals Re-Evaluation :     Exercise Goals Re-Evaluation    Row Name 05/16/17 1446             Exercise Goal Re-Evaluation   Exercise Goals Review Increase Physical Activity;Able to understand and use rate of perceived exertion (RPE)  scale;Knowledge and understanding of Target Heart Rate Range (THRR);Understanding of Exercise Prescription;Increase Strength and Stamina;Able to check pulse independently       Comments Reviewed home exercise with pt today.  Pt plans to walk, 2(15') bouts for exercise.  Reviewed THR, pulse, RPE, sign and symptoms, NTG use, and when to call 911 or MD.  Also discussed weather considerations and indoor options.  Pt voiced understanding.       Expected Outcomes Pt will be compliant with HEP and improve in cardiorespiratory fitness           Discharge Exercise Prescription (Final Exercise Prescription Changes):   Nutrition:  Target Goals: Understanding of nutrition guidelines, daily intake of sodium 1500mg , cholesterol 200mg , calories 30% from fat and 7% or less from saturated fats, daily to have 5 or more servings of fruits and vegetables.  Biometrics:     Pre Biometrics - 05/29/17 1359      Pre Biometrics   Height  (1.753 m)   Weight 176 lb 12.9 oz (80.2 kg)   Waist Circumference 35 inches   Hip Circumference 37.5 inches   Waist to Hip Ratio 0.93 %   BMI (Calculated) 26.1   Triceps Skinfold 20 mm   % Body Fat 25.7 %   Grip Strength 42 kg   Flexibility 14 in   Single Leg Stand 7 seconds       Nutrition Therapy Plan and Nutrition Goals:     Nutrition Therapy & Goals - 05/03/17 1542      Nutrition Therapy   Diet Therapeutic Lifestyle Changes     Personal Nutrition Goals   Nutrition Goal Pt to identify food quantities necessary to achieve weight loss of 6-15 lb at graduation from cardiac rehab.      Intervention Plan   Intervention Prescribe, educate and counsel regarding individualized specific dietary modifications aiming towards targeted core components such as  weight, hypertension, lipid management, diabetes, heart failure and other comorbidities.   Expected Outcomes Short Term Goal: Understand basic principles of dietary content, such as calories, fat, sodium,  cholesterol and nutrients.;Long Term Goal: Adherence to prescribed nutrition plan.      Nutrition Discharge: Nutrition Scores:     Nutrition Assessments - 05/03/17 1542      MEDFICTS Scores   Pre Score 30      Nutrition Goals Re-Evaluation:   Nutrition Goals Re-Evaluation:   Nutrition Goals Discharge (Final Nutrition Goals Re-Evaluation):   Psychosocial: Target Goals: Acknowledge presence or absence of significant depression and/or stress, maximize coping skills, provide positive support system. Participant is able to verbalize types and ability to use techniques and skills needed for reducing stress and depression.  Initial Review & Psychosocial Screening:     Initial Psych Review & Screening - 05/03/17 1654      Initial Review   Current issues with None Identified     Family Dynamics   Good Support System? Yes     Barriers   Psychosocial barriers to participate in program There are no identifiable barriers or psychosocial needs.     Screening Interventions   Interventions Encouraged to exercise      Quality of Life Scores:     Quality of Life - 05/07/17 1417      Quality of Life Scores   Health/Function Pre 28.33 %   Socioeconomic Pre 28.13 %   Psych/Spiritual Pre 30 %   Family Pre 27 %   GLOBAL Pre 28.43 %  scores reviewed.  no concerns identified.       PHQ-9: Recent Review Flowsheet Data    Depression screen [facility] 2/9 05/07/2017 07/18/2015   Decreased Interest 0 0   Down, Depressed, Hopeless 0 0   PHQ - 2 Score 0 0     Interpretation of Total Score  Total Score Depression Severity:  1-4 = Minimal depression, 5-9 = Mild depression, 10-14 = Moderate depression, 15-19 = Moderately severe depression, 20-27 = Severe depression   Psychosocial Evaluation and Intervention:     Psychosocial Evaluation - 05/07/17 1414      Psychosocial Evaluation & Interventions   Interventions Encouraged to exercise with the program and follow exercise  prescription   Comments no psychosocial needs identified, no interventions necessary    Expected Outcomes pt will exhibit positive outlook with good coping skills.    Continue Psychosocial Services  No Follow up required      Psychosocial Re-Evaluation:     Psychosocial Re-Evaluation    Row Name 05/29/17 1402             Psychosocial Re-Evaluation   Current issues with None Identified       Comments no psychsocial needs identified, no interventions necessary       Expected Outcomes pt will exhibit positive outlook with good coping skills.        Interventions Encouraged to attend Cardiac Rehabilitation for the exercise       Continue Psychosocial Services  No Follow up required          Psychosocial Discharge (Final Psychosocial Re-Evaluation):     Psychosocial Re-Evaluation - 05/29/17 1402      Psychosocial Re-Evaluation   Current issues with None Identified   Comments no psychsocial needs identified, no interventions necessary   Expected Outcomes pt will exhibit positive outlook with good coping skills.    Interventions Encouraged to attend Cardiac Rehabilitation for the exercise   Continue Psychosocial  Services  No Follow up required      Vocational Rehabilitation: Provide vocational rehab assistance to qualifying candidates.   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 Participant will state understanding/return demonstration of topics presented.  Learning Barriers/Preferences:     Learning Barriers/Preferences - 05/03/17 1406      Learning Barriers/Preferences   Learning Barriers Sight   Learning Preferences Written Material;Video;Pictoral       Education Topics: Count Your Pulse:  -Group instruction provided by verbal instruction, demonstration, patient participation and written materials to support subject.  Instructors address importance of being able to find your pulse and how to count your pulse when at home without a heart monitor.  Patients get hands on experience counting their pulse with staff help and individually.   Heart Attack, Angina, and Risk Factor Modification:  -Group instruction provided by verbal instruction, video, and written materials to support subject.  Instructors address signs and symptoms of angina and heart attacks.    Also discuss risk factors for heart disease and how to make changes to improve heart health risk factors.   Functional Fitness:  -Group instruction provided by verbal instruction, demonstration, patient participation, and written materials to support subject.  Instructors address safety measures for doing things around the house.  Discuss how to get up and down off the floor, how to pick things up properly, how to safely get out of a chair without assistance, and balance training.   Meditation and Mindfulness:  -Group instruction provided by verbal instruction, patient participation, and written materials to support subject.  Instructor addresses importance of mindfulness and meditation practice to help reduce stress and improve awareness.  Instructor also leads participants through a meditation exercise.    Stretching for Flexibility and Mobility:  -Group instruction provided by verbal instruction, patient participation, and written materials to support subject.  Instructors lead participants through series of stretches that are designed to increase flexibility thus improving mobility.  These stretches are additional exercise for major muscle groups that are typically performed during regular warm up and cool down.   Hands Only CPR:  -Group verbal, video, and participation provides a  basic overview of AHA guidelines for community CPR. Role-play of emergencies allow participants the opportunity to practice calling for help and chest compression technique with discussion of AED use.   Hypertension: -Group verbal and written instruction that provides a basic overview of hypertension including the most recent diagnostic guidelines, risk factor reduction with self-care instructions and medication management.    Nutrition I class: Heart Healthy Eating:  -Group instruction provided by PowerPoint slides, verbal discussion, and written materials to support subject matter. The instructor gives an explanation and review of the Therapeutic Lifestyle Changes diet recommendations, which includes a discussion on lipid goals, dietary fat, sodium, fiber, plant stanol/sterol esters, sugar, and the components of a well-balanced, healthy diet.   CARDIAC REHAB PHASE II EXERCISE from 05/21/2017 in [facility] CARDIAC REHAB  Date  05/22/17  Educator  RD  Instruction Review Code  2- meets goals/outcomes      Nutrition II class: Lifestyle Skills:  -Group instruction provided by PowerPoint slides, verbal discussion, and written materials to support  subject matter. The instructor gives an explanation and review of label reading, grocery shopping for heart health, heart healthy recipe modifications, and ways to make healthier choices when eating out.   Diabetes Question & Answer:  -Group instruction provided by PowerPoint slides, verbal discussion, and written materials to support subject matter. The instructor gives an explanation and review of diabetes co-morbidities, pre- and post-prandial blood glucose goals, pre-exercise blood glucose goals, signs, symptoms, and treatment of hypoglycemia and hyperglycemia, and foot care basics.   Diabetes Blitz:  -Group instruction provided by PowerPoint slides, verbal discussion, and written materials to support subject matter. The instructor  gives an explanation and review of the physiology behind type 1 and type 2 diabetes, diabetes medications and rational behind using different medications, pre- and post-prandial blood glucose recommendations and Hemoglobin A1c goals, diabetes diet, and exercise including blood glucose guidelines for exercising safely.    Portion Distortion:  -Group instruction provided by PowerPoint slides, verbal discussion, written materials, and food models to support subject matter. The instructor gives an explanation of serving size versus portion size, changes in portions sizes over the last 20 years, and what consists of a serving from each food group.   Stress Management:  -Group instruction provided by verbal instruction, video, and written materials to support subject matter.  Instructors review role of stress in heart disease and how to cope with stress positively.     Exercising on Your Own:  -Group instruction provided by verbal instruction, power point, and written materials to support subject.  Instructors discuss benefits of exercise, components of exercise, frequency and intensity of exercise, and end points for exercise.  Also discuss use of nitroglycerin and activating EMS.  Review options of places to exercise outside of rehab.  Review guidelines for sex with heart disease.   Cardiac Drugs I:  -Group instruction provided by verbal instruction and written materials to support subject.  Instructor reviews cardiac drug classes: antiplatelets, anticoagulants, beta blockers, and statins.  Instructor discusses reasons, side effects, and lifestyle considerations for each drug class.   Cardiac Drugs II:  -Group instruction provided by verbal instruction and written materials to support subject.  Instructor reviews cardiac drug classes: angiotensin converting enzyme inhibitors (ACE-I), angiotensin II receptor blockers (ARBs), nitrates, and calcium channel blockers.  Instructor discusses reasons, side  effects, and lifestyle considerations for each drug class.   Anatomy and Physiology of the Circulatory System:  Group verbal and written instruction and models provide basic cardiac anatomy and physiology, with the coronary electrical and arterial systems. Review of: AMI, Angina, Valve disease, Heart Failure, Peripheral Artery Disease, Cardiac Arrhythmia, Pacemakers, and the ICD.   Other Education:  -Group or individual verbal, written, or video instructions that support the educational goals of the cardiac rehab program.   Knowledge Questionnaire Score:     Knowledge Questionnaire Score - 05/03/17 1556      Knowledge Questionnaire Score   Pre Score 22/24      Core Components/Risk Factors/Patient Goals at Admission:     Personal Goals and Risk Factors at Admission - 05/03/17 1615      Core Components/Risk Factors/Patient Goals on Admission   Lipids Yes   Intervention Provide education and support for participant on nutrition & aerobic/resistive exercise along with prescribed medications to achieve LDL 70mg , HDL >40mg .   Expected Outcomes Short Term: Participant states understanding of desired cholesterol values and is compliant with medications prescribed. Participant is following exercise prescription and nutrition guidelines.;Long Term: Cholesterol controlled with medications as prescribed,  with individualized exercise RX and with personalized nutrition plan. Value goals: LDL < , HDL > 40 mg.      Core Components/Risk Factors/Patient Goals Review:      Goals and Risk Factor Review    Row Name 05/29/17 1400             Core Components/Risk Factors/Patient Goals Review   Personal Goals Review Lipids       Review pt with high lipids demonstrates eagerness to participate in CR exercise, nutrition and lifestyle modification education        Expected Outcomes pt will participate in CR exercise,nutrtition and lifestyle modification to reduce overall RF.            Core Components/Risk Factors/Patient Goals at Discharge (Final Review):      Goals and Risk Factor Review - 05/29/17 1400      Core Components/Risk Factors/Patient Goals Review   Personal Goals Review Lipids   Review pt with high lipids demonstrates eagerness to participate in CR exercise, nutrition and lifestyle modification education    Expected Outcomes pt will participate in CR exercise,nutrtition and lifestyle modification to reduce overall RF.       ITP Comments:     ITP Comments    Row Name 05/03/17 1403           ITP Comments Medical Director, Dr. Armanda Magic          Comments: Pt is making expected progress toward personal goals after completing 8 sessions. Recommend continued exercise and life style modification education including  stress management and relaxation techniques to decrease cardiac risk profile.

## 2017-05-30 ENCOUNTER — Encounter (HOSPITAL_COMMUNITY)
Admission: RE | Admit: 2017-05-30 | Discharge: 2017-05-30 | Disposition: A | Discharge status: Home / Self Care | Payer: POS | Source: Ambulatory Visit | Attending: Cardiology | Admitting: Cardiology

## 2017-05-30 DIAGNOSIS — I214 Non-ST elevation (NSTEMI) myocardial infarction: Secondary | ICD-10-CM | POA: Diagnosis not present

## 2017-05-30 DIAGNOSIS — Z955 Presence of coronary angioplasty implant and graft: Secondary | ICD-10-CM

## 2017-05-30 NOTE — Progress Notes (Signed)
Cardiac Individual Treatment Plan  Patient Details  Name: Peter Lynn MRN: [id number] Date of Birth: 1950-11-02 Referring Provider:     CARDIAC REHAB PHASE II ORIENTATION from 05/03/2017 in [facility] CARDIAC REHAB  Referring Provider  [place], Peter MD      Initial Encounter Date:    CARDIAC REHAB PHASE II ORIENTATION from 05/03/2017 in [facility] CARDIAC REHAB  Date  05/03/17  Referring Provider  [place], Peter MD      Visit Diagnosis: Status post coronary artery stent placement  NSTEMI (non-ST elevated myocardial infarction) [facility])  Patient's Home Medications on Admission:  Current Outpatient Prescriptions:  .  aspirin 81 MG EC tablet, Take 1 tablet (81 mg total) by mouth daily., Disp: 30 tablet, Rfl: 11 .  atorvastatin (LIPITOR) 80 MG tablet, Take 1 tablet (80 mg total) by mouth daily at 6 PM., Disp: 30 tablet, Rfl: 6 .  metoprolol succinate (TOPROL-XL) 25 MG 24 hr tablet, Take 1 tablet (25 mg total) by mouth daily., Disp: 30 tablet, Rfl: 6 .  nitroGLYCERIN (NITROSTAT) 0.4 MG SL tablet, Place 1 tablet (0.4 mg total) under the tongue every 5 (five) minutes x 3 doses as needed for chest pain., Disp: 25 tablet, Rfl: 12 .  ticagrelor (BRILINTA) 90 MG TABS tablet, Take 1 tablet (90 mg total) by mouth 2 (two) times daily., Disp: 180 tablet, Rfl: 3 .  ZYRTEC-D ALLERGY & CONGESTION 5-120 MG tablet, Take 1 tablet by mouth daily., Disp: , Rfl: 0  Past Medical History: Past Medical History:  Diagnosis Date  . CAD (coronary artery disease)    a. cath 03/27/17 - ingle vessel occlusive CAD with thrombotic mid RCA occlusion s/p PTCA and DES. recomended femoral approach   . COPD (chronic obstructive pulmonary disease) (HCC)   . Glaucoma   . Hyperlipidemia LDL goal <70   . NSTEMI (non-ST elevated myocardial infarction) (HCC)     Tobacco Use: History  Smoking Status  . Former Smoker  Smokeless Tobacco  . Never Used    Comment: QUIT SMOKING OVER 30  YEARS AGO    Labs: Recent Review Flowsheet Data    Labs for ITP Cardiac and Pulmonary Rehab Latest Ref Rng & Units 03/27/2017   Cholestrol 0 - 200 mg/dL 782   LDLCALC 0 - 99 mg/dL 97   HDL >95 mg/dL 49   Trlycerides <621 mg/dL 45   Hemoglobin H0Q % 5.9      Capillary Blood Glucose: No results found for: GLUCAP   Exercise Target Goals:    Exercise Program Goal: Individual exercise prescription set with THRR, safety & activity barriers. Participant demonstrates ability to understand and report RPE using BORG scale, to self-measure pulse accurately, and to acknowledge the importance of the exercise prescription.  Exercise Prescription Goal: Starting with aerobic activity 30 plus minutes a day, 3 days per week for initial exercise prescription. Provide home exercise prescription and guidelines that participant acknowledges understanding prior to discharge.  Activity Barriers & Risk Stratification:     Activity Barriers & Cardiac Risk Stratification - 05/03/17 1407      Activity Barriers & Cardiac Risk Stratification   Activity Barriers Deconditioning;Muscular Weakness;Other (comment)   Comments occasional knee pain, R   Cardiac Risk Stratification High      6 Minute Walk:     6 Minute Walk    Row Name 05/03/17 1556         6 Minute Walk   Phase Initial     Distance  1734 feet     Walk Time 6 minutes     # of Rest Breaks 0     MPH 3.28     METS 3.89     RPE 11     Symptoms No     Resting HR 63 bpm     Resting BP 110/74     Resting Oxygen Saturation  100 %     Exercise Oxygen Saturation  during 6 min walk 99 %     Max Ex. HR 108 bpm     Max Ex. BP 122/60     2 Minute Post BP 120/78        Oxygen Initial Assessment:   Oxygen Re-Evaluation:   Oxygen Discharge (Final Oxygen Re-Evaluation):   Initial Exercise Prescription:     Initial Exercise Prescription - 05/03/17 1600      Date of Initial Exercise RX and Referring Provider   Date 05/03/17    Referring Provider [place], Peter MD     Bike   Level 0.8   Minutes 15   METs 2.89     NuStep   Level 3   SPM 80   Minutes 15   METs 2     Track   Laps 15   Minutes 15   METs 2.74     Prescription Details   Frequency (times per week) 3   Duration Progress to 30 minutes of continuous aerobic without signs/symptoms of physical distress     Intensity   THRR 40-80% of Max Heartrate 61-122   Ratings of Perceived Exertion 11-13   Perceived Dyspnea 0-4     Progression   Progression Continue to progress workloads to maintain intensity without signs/symptoms of physical distress.     Resistance Training   Training Prescription Yes   Weight 3lbs   Reps 10-15      Perform Capillary Blood Glucose checks as needed.  Exercise Prescription Changes:     Exercise Prescription Changes    Row Name 05/07/17 1645 05/29/17 1600           Response to Exercise   Blood Pressure (Admit) 104/60 106/80      Blood Pressure (Exercise) 144/80 158/80      Blood Pressure (Exit) 122/70 130/80      Heart Rate (Admit) 64 bpm 61 bpm      Heart Rate (Exercise) 97 bpm 116 bpm      Heart Rate (Exit) 62 bpm 61 bpm      Rating of Perceived Exertion (Exercise) 11 12      Symptoms none none      Comments pt was oriented to exercise equiment today  -      Duration Continue with 30 min of aerobic exercise without signs/symptoms of physical distress. Continue with 30 min of aerobic exercise without signs/symptoms of physical distress.      Intensity THRR unchanged THRR unchanged        Progression   Progression Continue to progress workloads to maintain intensity without signs/symptoms of physical distress. Continue to progress workloads to maintain intensity without signs/symptoms of physical distress.      Average METs 3.5 3.6        Resistance Training   Training Prescription Yes Yes      Weight 2lbs 3lbs      Reps 10-15 10-15      Time 10 Minutes 10 Minutes        Bike   Level 1.4 1.4  Minutes 15 15      METs 4.24 4.3        Track   Laps 15 16      Minutes 15 15      METs 2.74 2.86        Home Exercise Plan   Plans to continue exercise at  - Home (comment)  walking, 2(15'bouts)      Frequency  - Add 2 additional days to program exercise sessions.      Initial Home Exercises Provided  - 05/16/17         Exercise Comments:     Exercise Comments    Row Name 05/29/17 1648           Exercise Comments Reviewed METs and goals. Pt is tolerating exercise very well; will continue to monitor pt's progress and activity levels.           Exercise Goals and Review:     Exercise Goals    Row Name 05/03/17 1413             Exercise Goals   Increase Physical Activity Yes  return to yardwork       Intervention Provide advice, education, support and counseling about physical activity/exercise needs.;Develop an individualized exercise prescription for aerobic and resistive training based on initial evaluation findings, risk stratification, comorbidities and participant's personal goals.       Expected Outcomes Achievement of increased cardiorespiratory fitness and enhanced flexibility, muscular endurance and strength shown through measurements of functional capacity and personal statement of participant.       Increase Strength and Stamina Yes  learn activity/exercise limitations       Intervention Provide advice, education, support and counseling about physical activity/exercise needs.;Develop an individualized exercise prescription for aerobic and resistive training based on initial evaluation findings, risk stratification, comorbidities and participant's personal goals.       Expected Outcomes Achievement of increased cardiorespiratory fitness and enhanced flexibility, muscular endurance and strength shown through measurements of functional capacity and personal statement of participant.       Able to understand and use rate of perceived exertion (RPE) scale Yes        Intervention Provide education and explanation on how to use RPE scale       Expected Outcomes Short Term: Able to use RPE daily in rehab to express subjective intensity level;Long Term:  Able to use RPE to guide intensity level when exercising independently       Knowledge and understanding of Target Heart Rate Range (THRR) Yes       Intervention Provide education and explanation of THRR including how the numbers were predicted and where they are located for reference       Expected Outcomes Short Term: Able to state/look up THRR;Long Term: Able to use THRR to govern intensity when exercising independently;Short Term: Able to use daily as guideline for intensity in rehab       Able to check pulse independently Yes       Intervention Provide education and demonstration on how to check pulse in carotid and radial arteries.;Review the importance of being able to check your own pulse for safety during independent exercise       Expected Outcomes Short Term: Able to explain why pulse checking is important during independent exercise;Long Term: Able to check pulse independently and accurately       Understanding of Exercise Prescription Yes       Intervention Provide education, explanation, and written  materials on patient's individual exercise prescription       Expected Outcomes Short Term: Able to explain program exercise prescription;Long Term: Able to explain home exercise prescription to exercise independently          Exercise Goals Re-Evaluation :     Exercise Goals Re-Evaluation    Row Name 05/16/17 1446 05/29/17 1648           Exercise Goal Re-Evaluation   Exercise Goals Review Increase Physical Activity;Able to understand and use rate of perceived exertion (RPE) scale;Knowledge and understanding of Target Heart Rate Range (THRR);Understanding of Exercise Prescription;Increase Strength and Stamina;Able to check pulse independently Increase Physical Activity;Able to understand and  use rate of perceived exertion (RPE) scale;Knowledge and understanding of Target Heart Rate Range (THRR);Understanding of Exercise Prescription;Increase Strength and Stamina;Able to check pulse independently      Comments Reviewed home exercise with pt today.  Pt plans to walk, 2(15') bouts for exercise.  Reviewed THR, pulse, RPE, sign and symptoms, NTG use, and when to call 911 or MD.  Also discussed weather considerations and indoor options.  Pt voiced understanding. Pt is compliant with exercise and is walking up to 40 minutes on Tues/Thurs without difficulty or extreme SOB.      Expected Outcomes Pt will be compliant with HEP and improve in cardiorespiratory fitness Pt will be compliant with HEP and improve in cardiorespiratory fitness          Discharge Exercise Prescription (Final Exercise Prescription Changes):     Exercise Prescription Changes - 05/29/17 1600      Response to Exercise   Blood Pressure (Admit) 106/80   Blood Pressure (Exercise) 158/80   Blood Pressure (Exit) 130/80   Heart Rate (Admit) 61 bpm   Heart Rate (Exercise) 116 bpm   Heart Rate (Exit) 61 bpm   Rating of Perceived Exertion (Exercise) 12   Symptoms none   Duration Continue with 30 min of aerobic exercise without signs/symptoms of physical distress.   Intensity THRR unchanged     Progression   Progression Continue to progress workloads to maintain intensity without signs/symptoms of physical distress.   Average METs 3.6     Resistance Training   Training Prescription Yes   Weight 3lbs   Reps 10-15   Time 10 Minutes     Bike   Level 1.4   Minutes 15   METs 4.3     Track   Laps 16   Minutes 15   METs 2.86     Home Exercise Plan   Plans to continue exercise at Home (comment)  walking, 2(15'bouts)   Frequency Add 2 additional days to program exercise sessions.   Initial Home Exercises Provided 05/16/17      Nutrition:  Target Goals: Understanding of nutrition guidelines, daily intake of  sodium 1500mg , cholesterol 200mg , calories 30% from fat and 7% or less from saturated fats, daily to have 5 or more servings of fruits and vegetables.  Biometrics:     Pre Biometrics - 05/29/17 1359      Pre Biometrics   Height  (1.753 m)   Weight 176 lb 12.9 oz (80.2 kg)   Waist Circumference 35 inches   Hip Circumference 37.5 inches   Waist to Hip Ratio 0.93 %   BMI (Calculated) 26.1   Triceps Skinfold 20 mm   % Body Fat 25.7 %   Grip Strength 42 kg   Flexibility 14 in   Single Leg Stand 7 seconds  Nutrition Therapy Plan and Nutrition Goals:     Nutrition Therapy & Goals - 05/03/17 1542      Nutrition Therapy   Diet Therapeutic Lifestyle Changes     Personal Nutrition Goals   Nutrition Goal Pt to identify food quantities necessary to achieve weight loss of 6-15 lb at graduation from cardiac rehab.      Intervention Plan   Intervention Prescribe, educate and counsel regarding individualized specific dietary modifications aiming towards targeted core components such as weight, hypertension, lipid management, diabetes, heart failure and other comorbidities.   Expected Outcomes Short Term Goal: Understand basic principles of dietary content, such as calories, fat, sodium, cholesterol and nutrients.;Long Term Goal: Adherence to prescribed nutrition plan.      Nutrition Discharge: Nutrition Scores:     Nutrition Assessments - 05/03/17 1542      MEDFICTS Scores   Pre Score 30      Nutrition Goals Re-Evaluation:   Nutrition Goals Re-Evaluation:   Nutrition Goals Discharge (Final Nutrition Goals Re-Evaluation):   Psychosocial: Target Goals: Acknowledge presence or absence of significant depression and/or stress, maximize coping skills, provide positive support system. Participant is able to verbalize types and ability to use techniques and skills needed for reducing stress and depression.  Initial Review & Psychosocial Screening:     Initial Psych  Review & Screening - 05/03/17 1654      Initial Review   Current issues with None Identified     Family Dynamics   Good Support System? Yes     Barriers   Psychosocial barriers to participate in program There are no identifiable barriers or psychosocial needs.     Screening Interventions   Interventions Encouraged to exercise      Quality of Life Scores:     Quality of Life - 05/07/17 1417      Quality of Life Scores   Health/Function Pre 28.33 %   Socioeconomic Pre 28.13 %   Psych/Spiritual Pre 30 %   Family Pre 27 %   GLOBAL Pre 28.43 %  scores reviewed.  no concerns identified.       PHQ-9: Recent Review Flowsheet Data    Depression screen [facility] 2/9 05/07/2017 07/18/2015   Decreased Interest 0 0   Down, Depressed, Hopeless 0 0   PHQ - 2 Score 0 0     Interpretation of Total Score  Total Score Depression Severity:  1-4 = Minimal depression, 5-9 = Mild depression, 10-14 = Moderate depression, 15-19 = Moderately severe depression, 20-27 = Severe depression   Psychosocial Evaluation and Intervention:     Psychosocial Evaluation - 05/07/17 1414      Psychosocial Evaluation & Interventions   Interventions Encouraged to exercise with the program and follow exercise prescription   Comments no psychosocial needs identified, no interventions necessary    Expected Outcomes pt will exhibit positive outlook with good coping skills.    Continue Psychosocial Services  No Follow up required      Psychosocial Re-Evaluation:     Psychosocial Re-Evaluation    Row Name 05/29/17 1402             Psychosocial Re-Evaluation   Current issues with None Identified       Comments no psychsocial needs identified, no interventions necessary       Expected Outcomes pt will exhibit positive outlook with good coping skills.        Interventions Encouraged to attend Cardiac Rehabilitation for the exercise  Continue Psychosocial Services  No Follow up required           Psychosocial Discharge (Final Psychosocial Re-Evaluation):     Psychosocial Re-Evaluation - 05/29/17 1402      Psychosocial Re-Evaluation   Current issues with None Identified   Comments no psychsocial needs identified, no interventions necessary   Expected Outcomes pt will exhibit positive outlook with good coping skills.    Interventions Encouraged to attend Cardiac Rehabilitation for the exercise   Continue Psychosocial Services  No Follow up required      Vocational Rehabilitation: Provide vocational rehab assistance to qualifying candidates.   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 Participant will state understanding/return demonstration of topics presented.  Learning Barriers/Preferences:     Learning Barriers/Preferences - 05/03/17 1406      Learning Barriers/Preferences   Learning Barriers Sight   Learning Preferences Written Material;Video;Pictoral      Education Topics: Count Your Pulse:  -Group instruction provided by verbal instruction, demonstration, patient participation and written materials to support subject.  Instructors address importance of being able to find your pulse and how to count your pulse when at home without a heart monitor.  Patients get hands on experience counting their pulse with staff help and individually.   Heart Attack, Angina, and Risk Factor Modification:  -Group instruction provided by verbal instruction, video, and written materials to support subject.  Instructors address signs and symptoms of angina and heart attacks.    Also discuss risk factors for heart disease and how to make changes to improve heart health  risk factors.   Functional Fitness:  -Group instruction provided by verbal instruction, demonstration, patient participation, and written materials to support subject.  Instructors address safety measures for doing things around the house.  Discuss how to get up and down off the floor, how to pick things up properly, how to safely get out of a chair without assistance, and balance training.   Meditation and Mindfulness:  -Group instruction provided by verbal instruction, patient participation, and written materials to support subject.  Instructor addresses importance of mindfulness and meditation practice to help reduce stress and improve awareness.  Instructor also leads participants through a meditation exercise.    Stretching for Flexibility and Mobility:  -Group instruction provided by verbal instruction, patient participation, and written materials to support subject.  Instructors lead participants through series of stretches that are designed to increase flexibility thus improving mobility.  These stretches are additional exercise for major muscle groups that are typically performed during regular warm up and cool down.   Hands Only CPR:  -Group verbal, video, and participation provides a basic overview of AHA guidelines for community CPR. Role-play of emergencies allow participants the opportunity to practice calling for help and chest compression technique with discussion of AED use.   Hypertension: -Group verbal and written instruction that provides a basic overview of hypertension including the most recent diagnostic guidelines, risk factor reduction with self-care instructions and medication management.    Nutrition I class: Heart Healthy Eating:  -Group instruction provided by PowerPoint slides, verbal discussion, and written materials to support subject matter. The instructor gives an explanation and review of the  Therapeutic Lifestyle Changes diet recommendations, which  includes a discussion on lipid goals, dietary fat, sodium, fiber, plant stanol/sterol esters, sugar, and the components of a well-balanced, healthy diet.   CARDIAC REHAB PHASE II EXERCISE from 05/21/2017 in [facility] CARDIAC REHAB  Date  05/22/17  Educator  RD  Instruction Review Code  2- meets goals/outcomes      Nutrition II class: Lifestyle Skills:  -Group instruction provided by PowerPoint slides, verbal discussion, and written materials to support subject matter. The instructor gives an explanation and review of label reading, grocery shopping for heart health, heart healthy recipe modifications, and ways to make healthier choices when eating out.   Diabetes Question & Answer:  -Group instruction provided by PowerPoint slides, verbal discussion, and written materials to support subject matter. The instructor gives an explanation and review of diabetes co-morbidities, pre- and post-prandial blood glucose goals, pre-exercise blood glucose goals, signs, symptoms, and treatment of hypoglycemia and hyperglycemia, and foot care basics.   Diabetes Blitz:  -Group instruction provided by PowerPoint slides, verbal discussion, and written materials to support subject matter. The instructor gives an explanation and review of the physiology behind type 1 and type 2 diabetes, diabetes medications and rational behind using different medications, pre- and post-prandial blood glucose recommendations and Hemoglobin A1c goals, diabetes diet, and exercise including blood glucose guidelines for exercising safely.    Portion Distortion:  -Group instruction provided by PowerPoint slides, verbal discussion, written materials, and food models to support subject matter. The instructor gives an explanation of serving size versus portion size, changes in portions sizes over the last 20 years, and what consists of a serving from each food group.   Stress Management:  -Group instruction provided by  verbal instruction, video, and written materials to support subject matter.  Instructors review role of stress in heart disease and how to cope with stress positively.     Exercising on Your Own:  -Group instruction provided by verbal instruction, power point, and written materials to support subject.  Instructors discuss benefits of exercise, components of exercise, frequency and intensity of exercise, and end points for exercise.  Also discuss use of nitroglycerin and activating EMS.  Review options of places to exercise outside of rehab.  Review guidelines for sex with heart disease.   Cardiac Drugs I:  -Group instruction provided by verbal instruction and written materials to support subject.  Instructor reviews cardiac drug classes: antiplatelets, anticoagulants, beta blockers, and statins.  Instructor discusses reasons, side effects, and lifestyle considerations for each drug class.   Cardiac Drugs II:  -Group instruction provided by verbal instruction and written materials to support subject.  Instructor reviews cardiac drug classes: angiotensin converting enzyme inhibitors (ACE-I), angiotensin II receptor blockers (ARBs), nitrates, and calcium channel blockers.  Instructor discusses reasons, side effects, and lifestyle considerations for each drug class.   Anatomy and Physiology of the Circulatory System:  Group verbal and written instruction and models provide basic cardiac anatomy and physiology, with the coronary electrical and arterial systems. Review of: AMI, Angina, Valve disease, Heart Failure, Peripheral Artery Disease, Cardiac Arrhythmia, Pacemakers, and the ICD.   Other Education:  -Group or individual verbal, written, or video instructions that support the educational goals of the cardiac rehab program.   Knowledge Questionnaire Score:     Knowledge Questionnaire Score - 05/03/17 1556      Knowledge Questionnaire Score   Pre Score 22/24      Core Components/Risk  Factors/Patient Goals at Admission:  Personal Goals and Risk Factors at Admission - 05/03/17 1615      Core Components/Risk Factors/Patient Goals on Admission   Lipids Yes   Intervention Provide education and support for participant on nutrition & aerobic/resistive exercise along with prescribed medications to achieve LDL 70mg , HDL >40mg .   Expected Outcomes Short Term: Participant states understanding of desired cholesterol values and is compliant with medications prescribed. Participant is following exercise prescription and nutrition guidelines.;Long Term: Cholesterol controlled with medications as prescribed, with individualized exercise RX and with personalized nutrition plan. Value goals: LDL < 70mg , HDL > 40 mg.      Core Components/Risk Factors/Patient Goals Review:      Goals and Risk Factor Review    Row Name 05/29/17 1400             Core Components/Risk Factors/Patient Goals Review   Personal Goals Review Lipids       Review pt with high lipids demonstrates eagerness to participate in CR exercise, nutrition and lifestyle modification education        Expected Outcomes pt will participate in CR exercise,nutrtition and lifestyle modification to reduce overall RF.           Core Components/Risk Factors/Patient Goals at Discharge (Final Review):      Goals and Risk Factor Review - 05/29/17 1400      Core Components/Risk Factors/Patient Goals Review   Personal Goals Review Lipids   Review pt with high lipids demonstrates eagerness to participate in CR exercise, nutrition and lifestyle modification education    Expected Outcomes pt will participate in CR exercise,nutrtition and lifestyle modification to reduce overall RF.       ITP Comments:     ITP Comments    Row Name 05/03/17 1403 05/30/17 1551         ITP Comments Medical Director, Dr. Armanda Magic 30 day review.  pt with good attendance and participation.           Comments:

## 2017-06-04 ENCOUNTER — Encounter (HOSPITAL_COMMUNITY)
Admission: RE | Admit: 2017-06-04 | Discharge: 2017-06-04 | Disposition: A | Discharge status: Home / Self Care | Payer: POS | Source: Ambulatory Visit | Attending: Cardiology | Admitting: Cardiology

## 2017-06-04 DIAGNOSIS — I214 Non-ST elevation (NSTEMI) myocardial infarction: Secondary | ICD-10-CM

## 2017-06-04 DIAGNOSIS — Z955 Presence of coronary angioplasty implant and graft: Secondary | ICD-10-CM

## 2017-06-06 ENCOUNTER — Encounter (HOSPITAL_COMMUNITY): Payer: POS

## 2017-06-09 NOTE — Progress Notes (Signed)
Cardiology Office Note    Date:  06/14/2017   ID:  Peter Lynn, DOB 07/09/1951, MRN [id number]  PCP:  Patient, No Pcp Per  Cardiologist:  Dr. SwazilandJordan   Chief Complaint  Patient presents with  . Follow-up    2-3 months  . Coronary Artery Disease    History of Present Illness:  Peter Lynn is a 66 y.o. male is seen for follow up CAD. He  presented to hospital on 03/27/2017 with NSTEMI. On arrival, he was hypokalemic with potassium of 3.4. Initial point-of-care troponin was 5.5, troponin eventually peaked at 11.7. Fasting lipid panel showed well-controlled cholesterol, HDL and triglyceride, however LDL was 97. Cardiac catheterization performed on 03/27/2017 showed 100% second RPLB lesion, 100% mid RCA lesion treated with Resolute Onyx 4.0 x 30 mm DES, 30% second diagonal, 50% mid to distal LAD lesion. Echocardiogram obtained on 04/04/2017 showed EF 60-65%, mild MR. Toprol-XL 25 mg daily was added. He was discharged on aspirin, Brilinta and high-dose statin.  On follow up today he is doing very well. No chest pain or dyspnea. He is walking every day and going to Rehab twice a week. Watching his diet carefully.     Past Medical History:  Diagnosis Date  . CAD (coronary artery disease)    a. cath 03/27/17 - ingle vessel occlusive CAD with thrombotic mid RCA occlusion s/p PTCA and DES. recomended femoral approach   . COPD (chronic obstructive pulmonary disease) (HCC)   . Glaucoma   . Hyperlipidemia LDL goal <70   . NSTEMI (non-ST elevated myocardial infarction) [facility](HCC)     Past Surgical History:  Procedure Laterality Date  . CARDIAC CATHETERIZATION    . CORONARY STENT INTERVENTION N/A 03/27/2017   Procedure: CORONARY STENT INTERVENTION;  Surgeon: SwazilandJordan, Peter M, MD;  [facility];  Service: Cardiovascular;  Laterality: N/A;  RCA  . LEFT HEART CATH AND CORONARY ANGIOGRAPHY N/A 03/27/2017   Procedure: LEFT HEART CATH AND CORONARY ANGIOGRAPHY;  Surgeon: SwazilandJordan, Peter M, MD;   [facility];  Service: Cardiovascular;  Laterality: N/A;    Current Medications: Outpatient Medications Prior to Visit  Medication Sig Dispense Refill  . aspirin 81 MG EC tablet Take 1 tablet (81 mg total) by mouth daily. 30 tablet 11  . atorvastatin (LIPITOR) 80 MG tablet Take 1 tablet (80 mg total) by mouth daily at 6 PM. 30 tablet 6  . metoprolol succinate (TOPROL-XL) 25 MG 24 hr tablet Take 1 tablet (25 mg total) by mouth daily. 30 tablet 6  . nitroGLYCERIN (NITROSTAT) 0.4 MG SL tablet Place 1 tablet (0.4 mg total) under the tongue every 5 (five) minutes x 3 doses as needed for chest pain. 25 tablet 12  . ticagrelor (BRILINTA) 90 MG TABS tablet Take 1 tablet (90 mg total) by mouth 2 (two) times daily. 180 tablet 3  . ZYRTEC-D ALLERGY & CONGESTION 5-120 MG tablet Take 1 tablet by mouth daily.  0   No facility-administered medications prior to visit.      Allergies:   Patient has no known allergies.   Social History   Social History  . Marital status: Single    Spouse name: N/A  . Number of children: N/A  . Years of education: N/A   Social History Main Topics  . Smoking status: Former Games developermoker  . Smokeless tobacco: Never Used     Comment: QUIT SMOKING OVER 30 YEARS AGO  . Alcohol use No  . Drug use: No  .  Sexual activity: Not Asked   Other Topics Concern  . None   Social History Narrative  . None     Family History:  The patient's family history includes Heart attack (age of onset: 69) in his mother.   ROS:   Please see the history of present illness.    ROS All other systems reviewed and are negative.   PHYSICAL EXAM:   VS:  BP 128/70   Pulse 63   Ht 5\' 9"  (1.753 m)   Wt 178 lb (80.7 kg)   BMI 26.29 kg/m    GEN: Well nourished, well developed, in no acute distress  HEENT: normal  Neck: no JVD, carotid bruits, or masses Cardiac: RRR; no murmurs, rubs, or gallops,no edema  Respiratory:  clear to auscultation bilaterally, normal work of  breathing GI: soft, nontender, nondistended, + BS MS: no deformity or atrophy  Skin: warm and dry, no rash Neuro:  Alert and Oriented x 3, Strength and sensation are intact Psych: euthymic mood, full affect  Wt Readings from Last 3 Encounters:  06/14/17 178 lb (80.7 kg)  05/29/17 176 lb 12.9 oz (80.2 kg)  05/03/17 176 lb 12.9 oz (80.2 kg)      Studies/Labs Reviewed:   EKG:  EKG is not ordered today.    Recent Labs: 03/27/2017: ALT 22; TSH 1.993 03/28/2017: BUN 9; Creatinine, Ser 1.09; Hemoglobin 12.2; Platelets 168; Potassium 3.5; Sodium 137   Lipid Panel    Component Value Date/Time   CHOL 155 03/27/2017 1953   TRIG 45 03/27/2017 1953   HDL 49 03/27/2017 1953   CHOLHDL 3.2 03/27/2017 1953   VLDL 9 03/27/2017 1953   LDLCALC 97 03/27/2017 1953    Additional studies/ records that were reviewed today include:   Cath 03/27/2017 Conclusion     Mid LAD to Dist LAD lesion, 30 %stenosed.  2nd Diag lesion, 30 %stenosed.  2nd RPLB lesion, 100 %stenosed.  There is mild left ventricular systolic dysfunction.  LV end diastolic pressure is mildly elevated.  The left ventricular ejection fraction is 50-55% by visual estimate.  A STENT RESOLUTE ONYX 4.0X30 drug eluting stent was successfully placed.  Mid RCA lesion, 100 %stenosed.  Post intervention, there is a 0% residual stenosis.   1. 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 The RCA was a very large dominant vessel. Although there were some collaterals the RCA was incompletely filled.  2. Mild LV dysfunction with inferior hypokinesis. 3. Mildly elevated LVEDP 4. Successful stenting of the mid RCA with DES.  Plan: the patient's anatomy was very challenging from the right radial approach. The RCA arises anteriorly and has a downward takeoff. The LCA arises high in the cusp and was very difficult to engage. If Cardiac cath needed in the future I would consider a femoral approach. While there were some  collaterals to the RCA I was concerned that these were inadequate and the RCA supplied a very large territory. There was also clearly viability of the inferior wall. Recommend DAPT for one year. Anticipate DC in am if stable.      Echo 04/04/2017 LV EF: 60% -   65%  Study Conclusions  - Left ventricle: The cavity size was normal. Wall thickness was   increased in a pattern of mild LVH. Systolic function was normal.   The estimated ejection fraction was in the range of 60% to 65%.   Wall motion was normal; there were no regional wall motion   abnormalities. Left ventricular  diastolic function parameters   were normal for the patient&'s age. - Mitral valve: There was mild regurgitation. - Right atrium: The atrium was mildly dilated.   ASSESSMENT:    1. Coronary artery disease involving native coronary artery of native heart without angina pectoris   2. Hyperlipidemia LDL goal <70      PLAN:  In order of problems listed above:  1. CAD: s/p NSTEMI, s/p DES to mid RCA. He is compliant with lifestyle modification and medications. He is asymptomatic. Follow up in 6 months with same therapy. He denies any recent angina since discharge 2. Hyperlipidemia:  currently on high-dose statin with Lipitor 80 mg. Obtain fasting lipid panel and chemistries today.     Medication Adjustments/Labs and Tests Ordered: Current medicines are reviewed at length with the patient today.  Concerns regarding medicines are outlined above.  Medication changes, Labs and Tests ordered today are listed in the Patient Instructions below. Patient Instructions  Continue your current therapy  We will check blood work today  I will see you 6 months    Signed, Peter Swaziland, MD  06/14/2017 9:13 AM    [facility] [address] [place], [place], [place]  [address] Phone: [phone]; Fax: [phone]

## 2017-06-11 ENCOUNTER — Encounter (HOSPITAL_COMMUNITY)
Admission: RE | Admit: 2017-06-11 | Discharge: 2017-06-11 | Disposition: A | Discharge status: Home / Self Care | Payer: POS | Source: Ambulatory Visit | Attending: Cardiology | Admitting: Cardiology

## 2017-06-11 DIAGNOSIS — I214 Non-ST elevation (NSTEMI) myocardial infarction: Secondary | ICD-10-CM

## 2017-06-11 DIAGNOSIS — Z955 Presence of coronary angioplasty implant and graft: Secondary | ICD-10-CM

## 2017-06-13 ENCOUNTER — Encounter (HOSPITAL_COMMUNITY)
Admission: RE | Admit: 2017-06-13 | Discharge: 2017-06-13 | Disposition: A | Discharge status: Home / Self Care | Payer: POS | Source: Ambulatory Visit | Attending: Cardiology | Admitting: Cardiology

## 2017-06-13 DIAGNOSIS — I214 Non-ST elevation (NSTEMI) myocardial infarction: Secondary | ICD-10-CM | POA: Diagnosis not present

## 2017-06-13 DIAGNOSIS — Z955 Presence of coronary angioplasty implant and graft: Secondary | ICD-10-CM

## 2017-06-14 ENCOUNTER — Ambulatory Visit (INDEPENDENT_AMBULATORY_CARE_PROVIDER_SITE_OTHER): Payer: POS | Admitting: Cardiology

## 2017-06-14 ENCOUNTER — Encounter: Payer: Self-pay | Admitting: Cardiology

## 2017-06-14 VITALS — BP 128/70 | HR 63 | Ht 69.0 in | Wt 178.0 lb

## 2017-06-14 DIAGNOSIS — I2510 Atherosclerotic heart disease of native coronary artery without angina pectoris: Secondary | ICD-10-CM

## 2017-06-14 DIAGNOSIS — E785 Hyperlipidemia, unspecified: Secondary | ICD-10-CM

## 2017-06-14 LAB — HEPATIC FUNCTION PANEL
ALBUMIN: 4.4 g/dL (ref 3.6–4.8)
ALT: 21 IU/L (ref 0–44)
AST: 26 IU/L (ref 0–40)
Alkaline Phosphatase: 71 IU/L (ref 39–117)
Bilirubin Total: 0.4 mg/dL (ref 0.0–1.2)
Bilirubin, Direct: 0.13 mg/dL (ref 0.00–0.40)
Total Protein: 7 g/dL (ref 6.0–8.5)

## 2017-06-14 LAB — BASIC METABOLIC PANEL
BUN / CREAT RATIO: 14 (ref 10–24)
BUN: 14 mg/dL (ref 8–27)
CO2: 23 mmol/L (ref 20–29)
Calcium: 9.2 mg/dL (ref 8.6–10.2)
Chloride: 104 mmol/L (ref 96–106)
Creatinine, Ser: 0.99 mg/dL (ref 0.76–1.27)
GFR calc non Af Amer: 78 mL/min/{1.73_m2} (ref >59)
GFR, EST AFRICAN AMERICAN: 90 mL/min/{1.73_m2} (ref >59)
GLUCOSE: 91 mg/dL (ref 65–99)
POTASSIUM: 4.3 mmol/L (ref 3.5–5.2)
SODIUM: 141 mmol/L (ref 134–144)

## 2017-06-14 LAB — LIPID PANEL W/O CHOL/HDL RATIO
Cholesterol, Total: 93 mg/dL — ABNORMAL LOW (ref 100–199)
HDL: 43 mg/dL (ref >39)
LDL Calculated: 26 mg/dL (ref 0–99)
Triglycerides: 120 mg/dL (ref 0–149)
VLDL CHOLESTEROL CAL: 24 mg/dL (ref 5–40)

## 2017-06-14 NOTE — Patient Instructions (Addendum)
Continue your current therapy  We will check blood work today  I will see you 6 months

## 2017-06-15 ENCOUNTER — Other Ambulatory Visit: Payer: Self-pay

## 2017-06-15 MED ORDER — TICAGRELOR 90 MG PO TABS: 90.0000 mg | ORAL_TABLET | Freq: Two times a day (BID) | ORAL | 3 refills | Status: DC

## 2017-06-15 MED ORDER — ATORVASTATIN CALCIUM 80 MG PO TABS: 80.0000 mg | ORAL_TABLET | Freq: Every day | ORAL | 1 refills | Status: DC

## 2017-06-15 MED ORDER — METOPROLOL SUCCINATE ER 25 MG PO TB24: 25.0000 mg | ORAL_TABLET | Freq: Every day | ORAL | 1 refills | Status: DC

## 2017-06-18 ENCOUNTER — Encounter (HOSPITAL_COMMUNITY)
Admission: RE | Admit: 2017-06-18 | Discharge: 2017-06-18 | Disposition: A | Discharge status: Home / Self Care | Payer: POS | Source: Ambulatory Visit | Attending: Cardiology | Admitting: Cardiology

## 2017-06-18 DIAGNOSIS — I214 Non-ST elevation (NSTEMI) myocardial infarction: Secondary | ICD-10-CM

## 2017-06-18 DIAGNOSIS — Z955 Presence of coronary angioplasty implant and graft: Secondary | ICD-10-CM

## 2017-06-20 ENCOUNTER — Encounter (HOSPITAL_COMMUNITY)
Admission: RE | Admit: 2017-06-20 | Discharge: 2017-06-20 | Disposition: A | Discharge status: Home / Self Care | Payer: POS | Source: Ambulatory Visit | Attending: Cardiology | Admitting: Cardiology

## 2017-06-20 DIAGNOSIS — Z955 Presence of coronary angioplasty implant and graft: Secondary | ICD-10-CM

## 2017-06-20 DIAGNOSIS — I214 Non-ST elevation (NSTEMI) myocardial infarction: Secondary | ICD-10-CM | POA: Diagnosis not present

## 2017-06-22 NOTE — Progress Notes (Signed)
Cardiac Individual Treatment Plan  Patient Details  Name: Peter Lynn MRN: [id number] Date of Birth: August 29, 1950 Referring Provider:     CARDIAC REHAB PHASE II ORIENTATION from 05/03/2017 in [facility] CARDIAC REHAB  Referring Provider  [place], Peter MD      Initial Encounter Date:    CARDIAC REHAB PHASE II ORIENTATION from 05/03/2017 in [facility] CARDIAC REHAB  Date  05/03/17  Referring Provider  [place], Peter MD      Visit Diagnosis: Status post coronary artery stent placement  NSTEMI (non-ST elevated myocardial infarction) [facility])  Patient's Home Medications on Admission:  Current Outpatient Prescriptions:  .  aspirin 81 MG EC tablet, Take 1 tablet (81 mg total) by mouth daily., Disp: 30 tablet, Rfl: 11 .  atorvastatin (LIPITOR) 80 MG tablet, Take 1 tablet (80 mg total) by mouth daily at 6 PM., Disp: 90 tablet, Rfl: 1 .  metoprolol succinate (TOPROL-XL) 25 MG 24 hr tablet, Take 1 tablet (25 mg total) by mouth daily., Disp: 90 tablet, Rfl: 1 .  nitroGLYCERIN (NITROSTAT) 0.4 MG SL tablet, Place 1 tablet (0.4 mg total) under the tongue every 5 (five) minutes x 3 doses as needed for chest pain., Disp: 25 tablet, Rfl: 12 .  ticagrelor (BRILINTA) 90 MG TABS tablet, Take 1 tablet (90 mg total) by mouth 2 (two) times daily., Disp: 180 tablet, Rfl: 3 .  ZYRTEC-D ALLERGY & CONGESTION 5-120 MG tablet, Take 1 tablet by mouth daily., Disp: , Rfl: 0  Past Medical History: Past Medical History:  Diagnosis Date  . CAD (coronary artery disease)    a. cath 03/27/17 - ingle vessel occlusive CAD with thrombotic mid RCA occlusion s/p PTCA and DES. recomended femoral approach   . COPD (chronic obstructive pulmonary disease) (HCC)   . Glaucoma   . Hyperlipidemia LDL goal <70   . NSTEMI (non-ST elevated myocardial infarction) (HCC)     Tobacco Use: History  Smoking Status  . Former Smoker  Smokeless Tobacco  . Never Used    Comment: QUIT SMOKING OVER 30  YEARS AGO    Labs: Recent Review Flowsheet Data    Labs for ITP Cardiac and Pulmonary Rehab Latest Ref Rng & Units 03/27/2017 06/14/2017   Cholestrol 100 - 199 mg/dL 409 81(X)   LDLCALC 0 - 99 mg/dL 97 26   HDL >91 mg/dL 49 43   Trlycerides 0 - 149 mg/dL 45 478   Hemoglobin G9F % 5.9 -      Capillary Blood Glucose: No results found for: GLUCAP   Exercise Target Goals:    Exercise Program Goal: Individual exercise prescription set with THRR, safety & activity barriers. Participant demonstrates ability to understand and report RPE using BORG scale, to self-measure pulse accurately, and to acknowledge the importance of the exercise prescription.  Exercise Prescription Goal: Starting with aerobic activity 30 plus minutes a day, 3 days per week for initial exercise prescription. Provide home exercise prescription and guidelines that participant acknowledges understanding prior to discharge.  Activity Barriers & Risk Stratification:     Activity Barriers & Cardiac Risk Stratification - 05/03/17 1407      Activity Barriers & Cardiac Risk Stratification   Activity Barriers Deconditioning;Muscular Weakness;Other (comment)   Comments occasional knee pain, R   Cardiac Risk Stratification High      6 Minute Walk:     6 Minute Walk    Row Name 05/03/17 1556         6 Minute Walk  Phase Initial     Distance 1734 feet     Walk Time 6 minutes     # of Rest Breaks 0     MPH 3.28     METS 3.89     RPE 11     Symptoms No     Resting HR 63 bpm     Resting BP 110/74     Resting Oxygen Saturation  100 %     Exercise Oxygen Saturation  during 6 min walk 99 %     Max Ex. HR 108 bpm     Max Ex. BP 122/60     2 Minute Post BP 120/78        Oxygen Initial Assessment:   Oxygen Re-Evaluation:   Oxygen Discharge (Final Oxygen Re-Evaluation):   Initial Exercise Prescription:     Initial Exercise Prescription - 05/03/17 1600      Date of Initial Exercise RX and  Referring Provider   Date 05/03/17   Referring Provider [place], Peter MD     Bike   Level 0.8   Minutes 15   METs 2.89     NuStep   Level 3   SPM 80   Minutes 15   METs 2     Track   Laps 15   Minutes 15   METs 2.74     Prescription Details   Frequency (times per week) 3   Duration Progress to 30 minutes of continuous aerobic without signs/symptoms of physical distress     Intensity   THRR 40-80% of Max Heartrate 61-122   Ratings of Perceived Exertion 11-13   Perceived Dyspnea 0-4     Progression   Progression Continue to progress workloads to maintain intensity without signs/symptoms of physical distress.     Resistance Training   Training Prescription Yes   Weight 3lbs   Reps 10-15      Perform Capillary Blood Glucose checks as needed.  Exercise Prescription Changes:     Exercise Prescription Changes    Row Name 05/07/17 1645 05/29/17 1600 06/11/17 1600         Response to Exercise   Blood Pressure (Admit) 104/60 106/80 116/80     Blood Pressure (Exercise) 144/80 158/80 150/80     Blood Pressure (Exit) 122/70 130/80 114/70     Heart Rate (Admit) 64 bpm 61 bpm 60 bpm     Heart Rate (Exercise) 97 bpm 116 bpm 88 bpm     Heart Rate (Exit) 62 bpm 61 bpm 68 bpm     Rating of Perceived Exertion (Exercise) 11 12 11      Symptoms none none none     Comments pt was oriented to exercise equiment today  -  -     Duration Continue with 30 min of aerobic exercise without signs/symptoms of physical distress. Continue with 30 min of aerobic exercise without signs/symptoms of physical distress. Continue with 30 min of aerobic exercise without signs/symptoms of physical distress.     Intensity THRR unchanged THRR unchanged THRR unchanged       Progression   Progression Continue to progress workloads to maintain intensity without signs/symptoms of physical distress. Continue to progress workloads to maintain intensity without signs/symptoms of physical distress. Continue  to progress workloads to maintain intensity without signs/symptoms of physical distress.     Average METs 3.5 3.6 3.2       Resistance Training   Training Prescription Yes Yes Yes     Weight 2lbs  3lbs 3lbs     Reps 10-15 10-15 10-15     Time 10 Minutes 10 Minutes 10 Minutes       Bike   Level 1.4 1.4  -     Minutes 15 15  -     METs 4.24 4.3  -       NuStep   Level  -  - 4     SPM  -  - 90     Minutes  -  - 15     METs  -  - 3.5       Track   Laps 15 16 15      Minutes 15 15 15      METs 2.74 2.86 2.74       Home Exercise Plan   Plans to continue exercise at  - Home (comment)  walking, 2(15'bouts) Home (comment)  walking, 2(15'bouts)     Frequency  - Add 2 additional days to program exercise sessions. Add 2 additional days to program exercise sessions.     Initial Home Exercises Provided  - 05/16/17 05/16/17        Exercise Comments:     Exercise Comments    Row Name 05/29/17 1648 06/20/17 1118         Exercise Comments Reviewed METs and goals. Pt is tolerating exercise very well; will continue to monitor pt's progress and activity levels.  Reviewed METs and goals. Pt is tolerating exercise very well; will continue to monitor pt's progress and activity levels.          Exercise Goals and Review:     Exercise Goals    Row Name 05/03/17 1413             Exercise Goals   Increase Physical Activity Yes  return to yardwork       Intervention Provide advice, education, support and counseling about physical activity/exercise needs.;Develop an individualized exercise prescription for aerobic and resistive training based on initial evaluation findings, risk stratification, comorbidities and participant's personal goals.       Expected Outcomes Achievement of increased cardiorespiratory fitness and enhanced flexibility, muscular endurance and strength shown through measurements of functional capacity and personal statement of participant.       Increase Strength and  Stamina Yes  learn activity/exercise limitations       Intervention Provide advice, education, support and counseling about physical activity/exercise needs.;Develop an individualized exercise prescription for aerobic and resistive training based on initial evaluation findings, risk stratification, comorbidities and participant's personal goals.       Expected Outcomes Achievement of increased cardiorespiratory fitness and enhanced flexibility, muscular endurance and strength shown through measurements of functional capacity and personal statement of participant.       Able to understand and use rate of perceived exertion (RPE) scale Yes       Intervention Provide education and explanation on how to use RPE scale       Expected Outcomes Short Term: Able to use RPE daily in rehab to express subjective intensity level;Long Term:  Able to use RPE to guide intensity level when exercising independently       Knowledge and understanding of Target Heart Rate Range (THRR) Yes       Intervention Provide education and explanation of THRR including how the numbers were predicted and where they are located for reference       Expected Outcomes Short Term: Able to state/look up THRR;Long Term: Able to use THRR to  govern intensity when exercising independently;Short Term: Able to use daily as guideline for intensity in rehab       Able to check pulse independently Yes       Intervention Provide education and demonstration on how to check pulse in carotid and radial arteries.;Review the importance of being able to check your own pulse for safety during independent exercise       Expected Outcomes Short Term: Able to explain why pulse checking is important during independent exercise;Long Term: Able to check pulse independently and accurately       Understanding of Exercise Prescription Yes       Intervention Provide education, explanation, and written materials on patient's individual exercise prescription        Expected Outcomes Short Term: Able to explain program exercise prescription;Long Term: Able to explain home exercise prescription to exercise independently          Exercise Goals Re-Evaluation :     Exercise Goals Re-Evaluation    Row Name 05/16/17 1446 05/29/17 1648 06/20/17 1118         Exercise Goal Re-Evaluation   Exercise Goals Review Increase Physical Activity;Able to understand and use rate of perceived exertion (RPE) scale;Knowledge and understanding of Target Heart Rate Range (THRR);Understanding of Exercise Prescription;Increase Strength and Stamina;Able to check pulse independently Increase Physical Activity;Able to understand and use rate of perceived exertion (RPE) scale;Knowledge and understanding of Target Heart Rate Range (THRR);Understanding of Exercise Prescription;Increase Strength and Stamina;Able to check pulse independently Increase Physical Activity;Able to understand and use rate of perceived exertion (RPE) scale;Knowledge and understanding of Target Heart Rate Range (THRR);Understanding of Exercise Prescription;Increase Strength and Stamina;Able to check pulse independently     Comments Reviewed home exercise with pt today.  Pt plans to walk, 2(15') bouts for exercise.  Reviewed THR, pulse, RPE, sign and symptoms, NTG use, and when to call 911 or MD.  Also discussed weather considerations and indoor options.  Pt voiced understanding. Pt is compliant with exercise and is walking up to 40 minutes on Tues/Thurs without difficulty or extreme SOB. Pt is compliant with exercise and is walking 40-45 minutes on Tues/Thurs without difficulty or extreme SOB.     Expected Outcomes Pt will be compliant with HEP and improve in cardiorespiratory fitness Pt will be compliant with HEP and improve in cardiorespiratory fitness Pt will be compliant with HEP and improve in cardiorespiratory fitness         Discharge Exercise Prescription (Final Exercise Prescription Changes):      Exercise Prescription Changes - 06/11/17 1600      Response to Exercise   Blood Pressure (Admit) 116/80   Blood Pressure (Exercise) 150/80   Blood Pressure (Exit) 114/70   Heart Rate (Admit) 60 bpm   Heart Rate (Exercise) 88 bpm   Heart Rate (Exit) 68 bpm   Rating of Perceived Exertion (Exercise) 11   Symptoms none   Duration Continue with 30 min of aerobic exercise without signs/symptoms of physical distress.   Intensity THRR unchanged     Progression   Progression Continue to progress workloads to maintain intensity without signs/symptoms of physical distress.   Average METs 3.2     Resistance Training   Training Prescription Yes   Weight 3lbs   Reps 10-15   Time 10 Minutes     NuStep   Level 4   SPM 90   Minutes 15   METs 3.5     Track   Laps 15   Minutes  15   METs 2.74     Home Exercise Plan   Plans to continue exercise at Home (comment)  walking, 2(15'bouts)   Frequency Add 2 additional days to program exercise sessions.   Initial Home Exercises Provided 05/16/17      Nutrition:  Target Goals: Understanding of nutrition guidelines, daily intake of sodium 1500mg , cholesterol 200mg , calories 30% from fat and 7% or less from saturated fats, daily to have 5 or more servings of fruits and vegetables.  Biometrics:     Pre Biometrics - 05/29/17 1359      Pre Biometrics   Height 5\' 9"  (1.753 m)   Weight 176 lb 12.9 oz (80.2 kg)   Waist Circumference 35 inches   Hip Circumference 37.5 inches   Waist to Hip Ratio 0.93 %   BMI (Calculated) 26.1   Triceps Skinfold 20 mm   % Body Fat 25.7 %   Grip Strength 42 kg   Flexibility 14 in   Single Leg Stand 7 seconds       Nutrition Therapy Plan and Nutrition Goals:     Nutrition Therapy & Goals - 05/03/17 1542      Nutrition Therapy   Diet Therapeutic Lifestyle Changes     Personal Nutrition Goals   Nutrition Goal Pt to identify food quantities necessary to achieve weight loss of 6-15 lb at graduation  from cardiac rehab.      Intervention Plan   Intervention Prescribe, educate and counsel regarding individualized specific dietary modifications aiming towards targeted core components such as weight, hypertension, lipid management, diabetes, heart failure and other comorbidities.   Expected Outcomes Short Term Goal: Understand basic principles of dietary content, such as calories, fat, sodium, cholesterol and nutrients.;Long Term Goal: Adherence to prescribed nutrition plan.      Nutrition Discharge: Nutrition Scores:     Nutrition Assessments - 05/03/17 1542      MEDFICTS Scores   Pre Score 30      Nutrition Goals Re-Evaluation:   Nutrition Goals Re-Evaluation:   Nutrition Goals Discharge (Final Nutrition Goals Re-Evaluation):   Psychosocial: Target Goals: Acknowledge presence or absence of significant depression and/or stress, maximize coping skills, provide positive support system. Participant is able to verbalize types and ability to use techniques and skills needed for reducing stress and depression.  Initial Review & Psychosocial Screening:     Initial Psych Review & Screening - 05/03/17 1654      Initial Review   Current issues with None Identified     Family Dynamics   Good Support System? Yes     Barriers   Psychosocial barriers to participate in program There are no identifiable barriers or psychosocial needs.     Screening Interventions   Interventions Encouraged to exercise      Quality of Life Scores:     Quality of Life - 05/07/17 1417      Quality of Life Scores   Health/Function Pre 28.33 %   Socioeconomic Pre 28.13 %   Psych/Spiritual Pre 30 %   Family Pre 27 %   GLOBAL Pre 28.43 %  scores reviewed.  no concerns identified.       PHQ-9: Recent Review Flowsheet Data    Depression screen [facility] 2/9 05/07/2017 07/18/2015   Decreased Interest 0 0   Down, Depressed, Hopeless 0 0   PHQ - 2 Score 0 0     Interpretation of Total Score   Total Score Depression Severity:  1-4 = Minimal depression, 5-9 =  Mild depression, 10-14 = Moderate depression, 15-19 = Moderately severe depression, 20-27 = Severe depression   Psychosocial Evaluation and Intervention:     Psychosocial Evaluation - 05/07/17 1414      Psychosocial Evaluation & Interventions   Interventions Encouraged to exercise with the program and follow exercise prescription   Comments no psychosocial needs identified, no interventions necessary    Expected Outcomes pt will exhibit positive outlook with good coping skills.    Continue Psychosocial Services  No Follow up required      Psychosocial Re-Evaluation:     Psychosocial Re-Evaluation    Row Name 05/29/17 1402 06/22/17 1215           Psychosocial Re-Evaluation   Current issues with None Identified None Identified      Comments no psychsocial needs identified, no interventions necessary no psychsocial needs identified, no interventions necessary      Expected Outcomes pt will exhibit positive outlook with good coping skills.  pt will exhibit positive outlook with good coping skills.       Interventions Encouraged to attend Cardiac Rehabilitation for the exercise Encouraged to attend Cardiac Rehabilitation for the exercise      Continue Psychosocial Services  No Follow up required No Follow up required         Psychosocial Discharge (Final Psychosocial Re-Evaluation):     Psychosocial Re-Evaluation - 06/22/17 1215      Psychosocial Re-Evaluation   Current issues with None Identified   Comments no psychsocial needs identified, no interventions necessary   Expected Outcomes pt will exhibit positive outlook with good coping skills.    Interventions Encouraged to attend Cardiac Rehabilitation for the exercise   Continue Psychosocial Services  No Follow up required      Vocational Rehabilitation: Provide vocational rehab assistance to qualifying candidates.   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 Participant will state understanding/return demonstration of topics presented.  Learning Barriers/Preferences:     Learning Barriers/Preferences - 05/03/17 1406      Learning Barriers/Preferences   Learning Barriers Sight   Learning Preferences Written Material;Video;Pictoral      Education Topics: Count Your Pulse:  -Group instruction provided by verbal instruction, demonstration, patient participation and written materials to support subject.  Instructors address importance of being able to find your pulse and how to count your pulse when at home without a heart monitor.  Patients get hands on experience counting their pulse with staff help and individually.   Heart Attack, Angina, and Risk Factor Modification:  -Group instruction provided by verbal instruction, video, and written materials to support subject.  Instructors address signs and symptoms of angina and heart attacks.    Also discuss risk factors for heart disease and how to make changes to improve heart health risk factors.   Functional Fitness:  -Group instruction provided by verbal instruction, demonstration, patient participation, and written materials to support subject.  Instructors address safety measures for doing things around the house.  Discuss how to get up and down off the floor, how to pick things up properly, how to safely get out of a chair without assistance, and balance training.   Meditation and Mindfulness:  -  Group instruction provided by verbal instruction, patient participation, and written materials to support subject.  Instructor addresses importance of mindfulness and  meditation practice to help reduce stress and improve awareness.  Instructor also leads participants through a meditation exercise.    Stretching for Flexibility and Mobility:  -Group instruction provided by verbal instruction, patient participation, and written materials to support subject.  Instructors lead participants through series of stretches that are designed to increase flexibility thus improving mobility.  These stretches are additional exercise for major muscle groups that are typically performed during regular warm up and cool down.   Hands Only CPR:  -Group verbal, video, and participation provides a basic overview of AHA guidelines for community CPR. Role-play of emergencies allow participants the opportunity to practice calling for help and chest compression technique with discussion of AED use.   Hypertension: -Group verbal and written instruction that provides a basic overview of hypertension including the most recent diagnostic guidelines, risk factor reduction with self-care instructions and medication management.    Nutrition I class: Heart Healthy Eating:  -Group instruction provided by PowerPoint slides, verbal discussion, and written materials to support subject matter. The instructor gives an explanation and review of the Therapeutic Lifestyle Changes diet recommendations, which includes a discussion on lipid goals, dietary fat, sodium, fiber, plant stanol/sterol esters, sugar, and the components of a well-balanced, healthy diet.   CARDIAC REHAB PHASE II EXERCISE from 05/21/2017 in [facility] CARDIAC REHAB  Date  05/22/17  Educator  RD  Instruction Review Code  2- meets goals/outcomes      Nutrition II class: Lifestyle Skills:  -Group instruction provided by PowerPoint slides, verbal discussion, and written materials to support subject matter. The instructor gives an explanation and review of label reading, grocery shopping for heart health, heart  healthy recipe modifications, and ways to make healthier choices when eating out.   Diabetes Question & Answer:  -Group instruction provided by PowerPoint slides, verbal discussion, and written materials to support subject matter. The instructor gives an explanation and review of diabetes co-morbidities, pre- and post-prandial blood glucose goals, pre-exercise blood glucose goals, signs, symptoms, and treatment of hypoglycemia and hyperglycemia, and foot care basics.   Diabetes Blitz:  -Group instruction provided by PowerPoint slides, verbal discussion, and written materials to support subject matter. The instructor gives an explanation and review of the physiology behind type 1 and type 2 diabetes, diabetes medications and rational behind using different medications, pre- and post-prandial blood glucose recommendations and Hemoglobin A1c goals, diabetes diet, and exercise including blood glucose guidelines for exercising safely.    Portion Distortion:  -Group instruction provided by PowerPoint slides, verbal discussion, written materials, and food models to support subject matter. The instructor gives an explanation of serving size versus portion size, changes in portions sizes over the last 20 years, and what consists of a serving from each food group.   Stress Management:  -Group instruction provided by verbal instruction, video, and written materials to support subject matter.  Instructors review role of stress in heart disease and how to cope with stress positively.     Exercising on Your Own:  -Group instruction provided by verbal instruction, power point, and written materials to support subject.  Instructors discuss benefits of exercise, components of exercise, frequency and intensity of exercise, and end points for exercise.  Also discuss use of nitroglycerin and activating EMS.  Review options of places to exercise outside of rehab.  Review guidelines for sex with heart  disease.  Cardiac Drugs I:  -Group instruction provided by verbal instruction and written materials to support subject.  Instructor reviews cardiac drug classes: antiplatelets, anticoagulants, beta blockers, and statins.  Instructor discusses reasons, side effects, and lifestyle considerations for each drug class.   Cardiac Drugs II:  -Group instruction provided by verbal instruction and written materials to support subject.  Instructor reviews cardiac drug classes: angiotensin converting enzyme inhibitors (ACE-I), angiotensin II receptor blockers (ARBs), nitrates, and calcium channel blockers.  Instructor discusses reasons, side effects, and lifestyle considerations for each drug class.   Anatomy and Physiology of the Circulatory System:  Group verbal and written instruction and models provide basic cardiac anatomy and physiology, with the coronary electrical and arterial systems. Review of: AMI, Angina, Valve disease, Heart Failure, Peripheral Artery Disease, Cardiac Arrhythmia, Pacemakers, and the ICD.   Other Education:  -Group or individual verbal, written, or video instructions that support the educational goals of the cardiac rehab program.   Knowledge Questionnaire Score:     Knowledge Questionnaire Score - 05/03/17 1556      Knowledge Questionnaire Score   Pre Score 22/24      Core Components/Risk Factors/Patient Goals at Admission:     Personal Goals and Risk Factors at Admission - 05/03/17 1615      Core Components/Risk Factors/Patient Goals on Admission   Lipids Yes   Intervention Provide education and support for participant on nutrition & aerobic/resistive exercise along with prescribed medications to achieve LDL 70mg , HDL >40mg .   Expected Outcomes Short Term: Participant states understanding of desired cholesterol values and is compliant with medications prescribed. Participant is following exercise prescription and nutrition guidelines.;Long Term: Cholesterol  controlled with medications as prescribed, with individualized exercise RX and with personalized nutrition plan. Value goals: LDL < 70mg , HDL > 40 mg.      Core Components/Risk Factors/Patient Goals Review:      Goals and Risk Factor Review    Row Name 05/29/17 1400 06/22/17 1214           Core Components/Risk Factors/Patient Goals Review   Personal Goals Review Lipids Lipids      Review pt with high lipids demonstrates eagerness to participate in CR exercise, nutrition and lifestyle modification education  pt with high lipids demonstrates eagerness to participate in CR exercise, nutrition and lifestyle modification education.  He is pleased to be free of cardiac symptoms.  pt walking at home.      Expected Outcomes pt will participate in CR exercise,nutrtition and lifestyle modification to reduce overall RF.  pt will participate in CR exercise,nutrtition and lifestyle modification to reduce overall RF.          Core Components/Risk Factors/Patient Goals at Discharge (Final Review):      Goals and Risk Factor Review - 06/22/17 1214      Core Components/Risk Factors/Patient Goals Review   Personal Goals Review Lipids   Review pt with high lipids demonstrates eagerness to participate in CR exercise, nutrition and lifestyle modification education.  He is pleased to be free of cardiac symptoms.  pt walking at home.   Expected Outcomes pt will participate in CR exercise,nutrtition and lifestyle modification to reduce overall RF.       ITP Comments:     ITP Comments    Row Name 05/03/17 1403 05/30/17 1551 06/22/17 1214       ITP Comments Medical Director, Dr. Armanda Magic 30 day review.  pt with good attendance and participation.   30 day review.  pt with  good attendance and participation.          Comments:

## 2017-06-25 ENCOUNTER — Other Ambulatory Visit: Payer: Self-pay | Admitting: *Deleted

## 2017-06-25 ENCOUNTER — Encounter (HOSPITAL_COMMUNITY)
Admission: RE | Admit: 2017-06-25 | Discharge: 2017-06-25 | Disposition: A | Discharge status: Home / Self Care | Payer: POS | Source: Ambulatory Visit | Attending: Cardiology | Admitting: Cardiology

## 2017-06-25 DIAGNOSIS — I214 Non-ST elevation (NSTEMI) myocardial infarction: Secondary | ICD-10-CM | POA: Insufficient documentation

## 2017-06-25 DIAGNOSIS — Z48812 Encounter for surgical aftercare following surgery on the circulatory system: Secondary | ICD-10-CM | POA: Diagnosis not present

## 2017-06-25 DIAGNOSIS — Z955 Presence of coronary angioplasty implant and graft: Secondary | ICD-10-CM | POA: Diagnosis not present

## 2017-06-25 MED ORDER — METOPROLOL SUCCINATE ER 25 MG PO TB24: 25.0000 mg | ORAL_TABLET | Freq: Every day | ORAL | 0 refills | Status: DC

## 2017-06-27 ENCOUNTER — Encounter (HOSPITAL_COMMUNITY)
Admission: RE | Admit: 2017-06-27 | Discharge: 2017-06-27 | Disposition: A | Discharge status: Home / Self Care | Payer: POS | Source: Ambulatory Visit | Attending: Cardiology | Admitting: Cardiology

## 2017-06-27 DIAGNOSIS — I214 Non-ST elevation (NSTEMI) myocardial infarction: Secondary | ICD-10-CM | POA: Diagnosis not present

## 2017-06-27 DIAGNOSIS — Z955 Presence of coronary angioplasty implant and graft: Secondary | ICD-10-CM

## 2017-07-02 ENCOUNTER — Encounter (HOSPITAL_COMMUNITY): Payer: POS

## 2017-07-04 ENCOUNTER — Encounter (HOSPITAL_COMMUNITY)
Admission: RE | Admit: 2017-07-04 | Discharge: 2017-07-04 | Disposition: A | Discharge status: Home / Self Care | Payer: POS | Source: Ambulatory Visit | Attending: Cardiology | Admitting: Cardiology

## 2017-07-04 DIAGNOSIS — I214 Non-ST elevation (NSTEMI) myocardial infarction: Secondary | ICD-10-CM

## 2017-07-04 DIAGNOSIS — Z955 Presence of coronary angioplasty implant and graft: Secondary | ICD-10-CM

## 2017-07-09 ENCOUNTER — Encounter (HOSPITAL_COMMUNITY)
Admission: RE | Admit: 2017-07-09 | Discharge: 2017-07-09 | Disposition: A | Discharge status: Home / Self Care | Payer: POS | Source: Ambulatory Visit | Attending: Cardiology | Admitting: Cardiology

## 2017-07-09 DIAGNOSIS — I214 Non-ST elevation (NSTEMI) myocardial infarction: Secondary | ICD-10-CM | POA: Diagnosis not present

## 2017-07-09 DIAGNOSIS — Z955 Presence of coronary angioplasty implant and graft: Secondary | ICD-10-CM

## 2017-07-11 ENCOUNTER — Encounter (HOSPITAL_COMMUNITY)
Admission: RE | Admit: 2017-07-11 | Discharge: 2017-07-11 | Disposition: A | Discharge status: Home / Self Care | Payer: POS | Source: Ambulatory Visit | Attending: Cardiology | Admitting: Cardiology

## 2017-07-11 DIAGNOSIS — I214 Non-ST elevation (NSTEMI) myocardial infarction: Secondary | ICD-10-CM | POA: Diagnosis not present

## 2017-07-11 DIAGNOSIS — Z955 Presence of coronary angioplasty implant and graft: Secondary | ICD-10-CM

## 2017-07-16 ENCOUNTER — Encounter (HOSPITAL_COMMUNITY)
Admission: RE | Admit: 2017-07-16 | Discharge: 2017-07-16 | Disposition: A | Discharge status: Home / Self Care | Payer: POS | Source: Ambulatory Visit | Attending: Cardiology | Admitting: Cardiology

## 2017-07-16 DIAGNOSIS — I214 Non-ST elevation (NSTEMI) myocardial infarction: Secondary | ICD-10-CM

## 2017-07-16 DIAGNOSIS — Z955 Presence of coronary angioplasty implant and graft: Secondary | ICD-10-CM

## 2017-07-18 ENCOUNTER — Encounter (HOSPITAL_COMMUNITY)
Admission: RE | Admit: 2017-07-18 | Discharge: 2017-07-18 | Disposition: A | Discharge status: Home / Self Care | Payer: POS | Source: Ambulatory Visit | Attending: Cardiology | Admitting: Cardiology

## 2017-07-18 DIAGNOSIS — I214 Non-ST elevation (NSTEMI) myocardial infarction: Secondary | ICD-10-CM

## 2017-07-18 DIAGNOSIS — Z955 Presence of coronary angioplasty implant and graft: Secondary | ICD-10-CM

## 2017-07-20 ENCOUNTER — Ambulatory Visit (HOSPITAL_COMMUNITY): Payer: Self-pay | Admitting: Cardiac Rehabilitation

## 2017-07-20 DIAGNOSIS — Z955 Presence of coronary angioplasty implant and graft: Secondary | ICD-10-CM

## 2017-07-20 DIAGNOSIS — I214 Non-ST elevation (NSTEMI) myocardial infarction: Secondary | ICD-10-CM

## 2017-07-20 NOTE — Progress Notes (Signed)
Cardiac Individual Treatment Plan  Patient Details  Name: Peter Lynn MRN: [id number] Date of Birth: 02/18/1951 Referring Provider:     CARDIAC REHAB [facility]ASE [facility]  Referring Provider  [place], Peter MD      Initial Encounter Date:    CARDIAC REHAB [facility]ASE [facility]  Date  05/03/17  Referring Provider  [place], Peter MD      Visit Diagnosis: NSTEMI (non-ST elevated myocardial infarction) [facility](HCC)  Status post coronary artery stent placement  Patient's Home Medications on Admission:  Current Outpatient Medications:  .  aspirin 81 MG EC tablet, Take 1 tablet (81 mg total) by mouth daily., Disp: 30 tablet, Rfl: 11 .  atorvastatin (LIPITOR) 80 MG tablet, Take 1 tablet (80 mg total) by mouth daily at 6 PM., Disp: 90 tablet, Rfl: 1 .  metoprolol succinate (TOPROL-XL) 25 MG 24 hr tablet, Take 1 tablet (25 mg total) daily by mouth., Disp: 7 tablet, Rfl: 0 .  nitroGLYCERIN (NITROSTAT) 0.4 MG SL tablet, Place 1 tablet (0.4 mg total) under the tongue every 5 (five) minutes x 3 doses as needed for chest pain., Disp: 25 tablet, Rfl: 12 .  ticagrelor (BRILINTA) 90 MG TABS tablet, Take 1 tablet (90 mg total) by mouth 2 (two) times daily., Disp: 180 tablet, Rfl: 3 .  ZYRTEC-D ALLERGY & CONGESTION 5-120 MG tablet, Take 1 tablet by mouth daily., Disp: , Rfl: 0  Past Medical History: Past Medical History:  Diagnosis Date  . CAD (coronary artery disease)    a. cath 03/27/17 - ingle vessel occlusive CAD with thrombotic mid RCA occlusion s/p PTCA and DES. recomended femoral approach   . COPD (chronic obstructive pulmonary disease) (HCC)   . Glaucoma   . Hyperlipidemia LDL goal <70   . NSTEMI (non-ST elevated myocardial infarction) (HCC)     Tobacco Use: Social History   Tobacco Use  Smoking Status Former Smoker  Smokeless Tobacco Never Used  Tobacco Comment   QUIT  SMOKING OVER 30 YEARS AGO    Labs: Recent Review Flowsheet Data    Labs for ITP Cardiac and Pulmonary Rehab Latest Ref Rng & Units 03/27/2017 06/14/2017   Cholestrol 100 - 199 mg/dL [id number]([id number](L)   LDLCALC 0 - 99 mg/dL 97 26   HDL >64>39 mg/dL 49 43   Trlycerides 0 - 149 mg/dL 45 [id number]   Hemoglobin R5JA1c % 5.9 -      Capillary Blood Glucose: No results found for: GLUCAP   Exercise Target Goals:    Exercise Program Goal: Individual exercise prescription set with THRR, safety & activity barriers. Participant demonstrates ability to understand and report RPE using BORG scale, to self-measure pulse accurately, and to acknowledge the importance of the exercise prescription.  Exercise Prescription Goal: Starting with aerobic activity 30 plus minutes a day, 3 days per week for initial exercise prescription. Provide home exercise prescription and guidelines that participant acknowledges understanding prior to discharge.  Activity Barriers & Risk Stratification:   6 Minute Walk:   Oxygen Initial Assessment:   Oxygen Re-Evaluation:   Oxygen Discharge (Final Oxygen Re-Evaluation):   Initial Exercise Prescription:   Perform Capillary Blood Glucose checks as needed.  Exercise Prescription Changes: Exercise Prescription Changes    Row Name 05/07/17 1645 05/29/17 1600 06/11/17 1600 07/04/17 1606 07/18/17 1516     Response to Exercise   Blood Pressure (Admit)  104/60  106/80  116/80  130/70  124/80   Blood Pressure (Exercise)  144/80  158/80  150/80  140/80  144/84   Blood Pressure (Exit)  122/70  130/80  114/70  134/70  112/60   Heart Rate (Admit)  64 bpm  61 bpm  60 bpm  51 bpm  62 bpm   Heart Rate (Exercise)  97 bpm  116 bpm  88 bpm  112 bpm  129 bpm   Heart Rate (Exit)  62 bpm  61 bpm  68 bpm  56 bpm  70 bpm   Rating of Perceived Exertion (Exercise)  11  12  11  12  12    Symptoms  none  none  none  none  none   Comments  pt was oriented to exercise equiment today  -  -  -  -    Duration  Continue with 30 min of aerobic exercise without signs/symptoms of physical distress.  Continue with 30 min of aerobic exercise without signs/symptoms of physical distress.  Continue with 30 min of aerobic exercise without signs/symptoms of physical distress.  Continue with 30 min of aerobic exercise without signs/symptoms of physical distress.  Continue with 30 min of aerobic exercise without signs/symptoms of physical distress.   Intensity  THRR unchanged  THRR unchanged  THRR unchanged  THRR unchanged  THRR unchanged     Progression   Progression  Continue to progress workloads to maintain intensity without signs/symptoms of physical distress.  Continue to progress workloads to maintain intensity without signs/symptoms of physical distress.  Continue to progress workloads to maintain intensity without signs/symptoms of physical distress.  Continue to progress workloads to maintain intensity without signs/symptoms of physical distress.  Continue to progress workloads to maintain intensity without signs/symptoms of physical distress.   Average METs  3.5  3.6  3.2  4.1  4.2     Resistance Training   Training Prescription  Yes  Yes  Yes  No Relaxation Day   No Relaxation Day    Weight  2lbs  3lbs  3lbs  -  -   Reps  10-15  10-15  10-15  -  -   Time  10 Minutes  10 Minutes  10 Minutes  -  -     Bike   Level  1.4  1.4  -  1.4  1.4   Minutes  15  15  -  15  15   METs  4.24  4.3  -  4.23  4.25     NuStep   Level  -  -  4  -  4   SPM  -  -  90  -  90   Minutes  -  -  15  -  15   METs  -  -  3.5  -  4.1     Track   Laps  15  16  15  28   -   Minutes  15  15  15  15   -   METs  2.74  2.86  2.74  4  -     Home Exercise Plan   Plans to continue exercise at  -  Home (comment) walking, 2(15'bouts)  Home (comment) walking, 2(15'bouts)  Home (comment) walking, 2(15'bouts)  Home (comment) walking, 2(15'bouts)   Frequency  -  Add 2 additional days to program exercise sessions.  Add 2  additional days to program exercise sessions.  Add 2 additional days to program exercise sessions.  Add  2 additional days to program exercise sessions.   Initial Home Exercises Provided  -  05/16/17  05/16/17  05/16/17  05/16/17      Exercise Comments: Exercise Comments    Row Name 05/29/17 1648 06/20/17 1118 07/19/17 1523       Exercise Comments  Reviewed METs and goals. Pt is tolerating exercise very well; will continue to monitor pt's progress and activity levels.   Reviewed METs and goals. Pt is tolerating exercise very well; will continue to monitor pt's progress and activity levels.   Reviewed METs and goals. Pt is tolerating exercise very well; will continue to monitor pt's progress and activity levels.         Exercise Goals and Review:   Exercise Goals Re-Evaluation : Exercise Goals Re-Evaluation    Row Name 05/29/17 1648 06/20/17 1118 07/19/17 1523         Exercise Goal Re-Evaluation   Exercise Goals Review  Increase Physical Activity;Able to understand and use rate of perceived exertion (RPE) scale;Knowledge and understanding of Target Heart Rate Range (THRR);Understanding of Exercise Prescription;Increase Strength and Stamina;Able to check pulse independently  Increase Physical Activity;Able to understand and use rate of perceived exertion (RPE) scale;Knowledge and understanding of Target Heart Rate Range (THRR);Understanding of Exercise Prescription;Increase Strength and Stamina;Able to check pulse independently  Increase Physical Activity;Able to understand and use rate of perceived exertion (RPE) scale;Knowledge and understanding of Target Heart Rate Range (THRR);Understanding of Exercise Prescription;Increase Strength and Stamina;Able to check pulse independently     Comments  Pt is compliant with exercise and is walking up to 40 minutes on Tues/Thurs without difficulty or extreme SOB.  Pt is compliant with exercise and is walking 40-45 minutes on Tues/Thurs without  difficulty or extreme SOB.  Pt is compliant with exercise and able to perform yard work without difficulity. Pt has a better understanding of exercise limitation.     Expected Outcomes  Pt will be compliant with HEP and improve in cardiorespiratory fitness  Pt will be compliant with HEP and improve in cardiorespiratory fitness  Pt will be compliant with HEP and improve in cardiorespiratory fitness         Discharge Exercise Prescription (Final Exercise Prescription Changes): Exercise Prescription Changes - 07/18/17 1516      Response to Exercise   Blood Pressure (Admit)  124/80    Blood Pressure (Exercise)  144/84    Blood Pressure (Exit)  112/60    Heart Rate (Admit)  62 bpm    Heart Rate (Exercise)  129 bpm    Heart Rate (Exit)  70 bpm    Rating of Perceived Exertion (Exercise)  12    Symptoms  none    Duration  Continue with 30 min of aerobic exercise without signs/symptoms of physical distress.    Intensity  THRR unchanged      Progression   Progression  Continue to progress workloads to maintain intensity without signs/symptoms of physical distress.    Average METs  4.2      Resistance Training   Training Prescription  No Relaxation Day       Bike   Level  1.4    Minutes  15    METs  4.25      NuStep   Level  4    SPM  90    Minutes  15    METs  4.1      Home Exercise Plan   Plans to continue exercise at  Home (comment) walking,  2(15'bouts)    Frequency  Add 2 additional days to program exercise sessions.    Initial Home Exercises Provided  05/16/17       Nutrition:  Target Goals: Understanding of nutrition guidelines, daily intake of sodium 1500mg , cholesterol 200mg , calories 30% from fat and 7% or less from saturated fats, daily to have 5 or more servings of fruits and vegetables.  Biometrics: Pre Biometrics - 05/29/17 1359      Pre Biometrics   Height  5\' 9"  (1.753 m)    Weight  176 lb 12.9 oz (80.2 kg)    Waist Circumference  35 inches    Hip  Circumference  37.5 inches    Waist to Hip Ratio  0.93 %    BMI (Calculated)  26.1    Triceps Skinfold  20 mm    % Body Fat  25.7 %    Grip Strength  42 kg    Flexibility  14 in    Single Leg Stand  7 seconds        Nutrition Therapy Plan and Nutrition Goals:   Nutrition Discharge: Nutrition Scores:   Nutrition Goals Re-Evaluation:   Nutrition Goals Re-Evaluation:   Nutrition Goals Discharge (Final Nutrition Goals Re-Evaluation):   Psychosocial: Target Goals: Acknowledge presence or absence of significant depression and/or stress, maximize coping skills, provide positive support system. Participant is able to verbalize types and ability to use techniques and skills needed for reducing stress and depression.  Initial Review & Psychosocial Screening:   Quality of Life Scores:   PHQ-9: Recent Review Flowsheet Data    Depression screen [facility] 2/9 05/07/2017 07/18/2015   Decreased Interest 0 0   Down, Depressed, Hopeless 0 0   PHQ - 2 Score 0 0     Interpretation of Total Score  Total Score Depression Severity:  1-4 = Minimal depression, 5-9 = Mild depression, 10-14 = Moderate depression, 15-19 = Moderately severe depression, 20-27 = Severe depression   Psychosocial Evaluation and Intervention:   Psychosocial Re-Evaluation: Psychosocial Re-Evaluation    Row Name 05/29/17 1402 06/22/17 1215 07/17/17 1716         Psychosocial Re-Evaluation   Current issues with  None Identified  None Identified  None Identified     Comments  no psychsocial needs identified, no interventions necessary  no psychsocial needs identified, no interventions necessary  no psychsocial needs identified, no interventions necessary     Expected Outcomes  pt will exhibit positive outlook with good coping skills.   pt will exhibit positive outlook with good coping skills.   pt will exhibit positive outlook with good coping skills.      Interventions  Encouraged to attend Cardiac Rehabilitation for  the exercise  Encouraged to attend Cardiac Rehabilitation for the exercise  Encouraged to attend Cardiac Rehabilitation for the exercise     Continue Psychosocial Services   No Follow up required  No Follow up required  No Follow up required        Psychosocial Discharge (Final Psychosocial Re-Evaluation): Psychosocial Re-Evaluation - 07/17/17 1716      Psychosocial Re-Evaluation   Current issues with  None Identified    Comments  no psychsocial needs identified, no interventions necessary    Expected Outcomes  pt will exhibit positive outlook with good coping skills.     Interventions  Encouraged to attend Cardiac Rehabilitation for the exercise    Continue Psychosocial Services   No Follow up required       Vocational Rehabilitation:  Provide vocational rehab assistance to qualifying candidates.   Vocational Rehab Evaluation & Intervention:   Education: Education Goals: Education classes will be provided on a weekly basis, covering required topics. Participant will state understanding/return demonstration of topics presented.  Learning Barriers/Preferences:   Education Topics: Count Your Pulse:  -Group instruction provided by verbal instruction, demonstration, patient participation and written materials to support subject.  Instructors address importance of being able to find your pulse and how to count your pulse when at home without a heart monitor.  Patients get hands on experience counting their pulse with staff help and individually.   Heart Attack, Angina, and Risk Factor Modification:  -Group instruction provided by verbal instruction, video, and written materials to support subject.  Instructors address signs and symptoms of angina and heart attacks.    Also discuss risk factors for heart disease and how to make changes to improve heart health risk factors.   Functional Fitness:  -Group instruction provided by verbal instruction, demonstration, patient participation,  and written materials to support subject.  Instructors address safety measures for doing things around the house.  Discuss how to get up and down off the floor, how to pick things up properly, how to safely get out of a chair without assistance, and balance training.   Meditation and Mindfulness:  -Group instruction provided by verbal instruction, patient participation, and written materials to support subject.  Instructor addresses importance of mindfulness and meditation practice to help reduce stress and improve awareness.  Instructor also leads participants through a meditation exercise.    Stretching for Flexibility and Mobility:  -Group instruction provided by verbal instruction, patient participation, and written materials to support subject.  Instructors lead participants through series of stretches that are designed to increase flexibility thus improving mobility.  These stretches are additional exercise for major muscle groups that are typically performed during regular warm up and cool down.   Hands Only CPR:  -Group verbal, video, and participation provides a basic overview of AHA guidelines for community CPR. Role-play of emergencies allow participants the opportunity to practice calling for help and chest compression technique with discussion of AED use.   Hypertension: -Group verbal and written instruction that provides a basic overview of hypertension including the most recent diagnostic guidelines, risk factor reduction with self-care instructions and medication management.    Nutrition I class: Heart Healthy Eating:  -Group instruction provided by PowerPoint slides, verbal discussion, and written materials to support subject matter. The instructor gives an explanation and review of the Therapeutic Lifestyle Changes diet recommendations, which includes a discussion on lipid goals, dietary fat, sodium, fiber, plant stanol/sterol esters, sugar, and the components of a  well-balanced, healthy diet.   CARDIAC REHAB PHASE II EXERCISE from 05/21/2017 in [facility] CARDIAC REHAB  Date  05/22/17  Educator  RD  Instruction Review Code  2- meets goals/outcomes      Nutrition II class: Lifestyle Skills:  -Group instruction provided by PowerPoint slides, verbal discussion, and written materials to support subject matter. The instructor gives an explanation and review of label reading, grocery shopping for heart health, heart healthy recipe modifications, and ways to make healthier choices when eating out.   Diabetes Question & Answer:  -Group instruction provided by PowerPoint slides, verbal discussion, and written materials to support subject matter. The instructor gives an explanation and review of diabetes co-morbidities, pre- and post-prandial blood glucose goals, pre-exercise blood glucose goals, signs, symptoms, and treatment of hypoglycemia and hyperglycemia, and foot care  basics.   Diabetes Blitz:  -Group instruction provided by PowerPoint slides, verbal discussion, and written materials to support subject matter. The instructor gives an explanation and review of the physiology behind type 1 and type 2 diabetes, diabetes medications and rational behind using different medications, pre- and post-prandial blood glucose recommendations and Hemoglobin A1c goals, diabetes diet, and exercise including blood glucose guidelines for exercising safely.    Portion Distortion:  -Group instruction provided by PowerPoint slides, verbal discussion, written materials, and food models to support subject matter. The instructor gives an explanation of serving size versus portion size, changes in portions sizes over the last 20 years, and what consists of a serving from each food group.   Stress Management:  -Group instruction provided by verbal instruction, video, and written materials to support subject matter.  Instructors review role of stress in heart  disease and how to cope with stress positively.     Exercising on Your Own:  -Group instruction provided by verbal instruction, power point, and written materials to support subject.  Instructors discuss benefits of exercise, components of exercise, frequency and intensity of exercise, and end points for exercise.  Also discuss use of nitroglycerin and activating EMS.  Review options of places to exercise outside of rehab.  Review guidelines for sex with heart disease.   Cardiac Drugs I:  -Group instruction provided by verbal instruction and written materials to support subject.  Instructor reviews cardiac drug classes: antiplatelets, anticoagulants, beta blockers, and statins.  Instructor discusses reasons, side effects, and lifestyle considerations for each drug class.   Cardiac Drugs II:  -Group instruction provided by verbal instruction and written materials to support subject.  Instructor reviews cardiac drug classes: angiotensin converting enzyme inhibitors (ACE-I), angiotensin II receptor blockers (ARBs), nitrates, and calcium channel blockers.  Instructor discusses reasons, side effects, and lifestyle considerations for each drug class.   Anatomy and Physiology of the Circulatory System:  Group verbal and written instruction and models provide basic cardiac anatomy and physiology, with the coronary electrical and arterial systems. Review of: AMI, Angina, Valve disease, Heart Failure, Peripheral Artery Disease, Cardiac Arrhythmia, Pacemakers, and the ICD.   Other Education:  -Group or individual verbal, written, or video instructions that support the educational goals of the cardiac rehab program.   Knowledge Questionnaire Score:   Core Components/Risk Factors/Patient Goals at Admission:   Core Components/Risk Factors/Patient Goals Review:  Goals and Risk Factor Review    Row Name 05/29/17 1400 06/22/17 1214 07/17/17 1714         Core Components/Risk Factors/Patient Goals  Review   Personal Goals Review  Lipids  Lipids  Lipids     Review  pt with high lipids demonstrates eagerness to participate in CR exercise, nutrition and lifestyle modification education   pt with high lipids demonstrates eagerness to participate in CR exercise, nutrition and lifestyle modification education.  He is pleased to be free of cardiac symptoms.  pt walking at home.  pt with high lipids demonstrates eagerness to participate in CR exercise, nutrition and lifestyle modification education.  He is pleased to be free of cardiac symptoms.  pt walking at home which has weather and safety challenges.  pt is looking for indoor facility to walk.       Expected Outcomes  pt will participate in CR exercise,nutrtition and lifestyle modification to reduce overall RF.   pt will participate in CR exercise,nutrtition and lifestyle modification to reduce overall RF.   pt will participate in CR exercise,nutrtition and  lifestyle modification to reduce overall RF.         Core Components/Risk Factors/Patient Goals at Discharge (Final Review):  Goals and Risk Factor Review - 07/17/17 1714      Core Components/Risk Factors/Patient Goals Review   Personal Goals Review  Lipids    Review  pt with high lipids demonstrates eagerness to participate in CR exercise, nutrition and lifestyle modification education.  He is pleased to be free of cardiac symptoms.  pt walking at home which has weather and safety challenges.  pt is looking for indoor facility to walk.      Expected Outcomes  pt will participate in CR exercise,nutrtition and lifestyle modification to reduce overall RF.        ITP Comments: ITP Comments    Row Name 05/30/17 1551 06/22/17 1214 07/17/17 1710       ITP Comments  30 day review.  pt with good attendance and participation.    30 day review.  pt with good attendance and participation.    30 day ITP review.  pt with good attendance and participation. pt exhibits positive interactions with staff  and peers. pt expresses gratitude for care recieved.          Comments:

## 2017-07-23 ENCOUNTER — Encounter (HOSPITAL_COMMUNITY)
Admission: RE | Admit: 2017-07-23 | Discharge: 2017-07-23 | Disposition: A | Discharge status: Home / Self Care | Payer: POS | Source: Ambulatory Visit | Attending: Cardiology | Admitting: Cardiology

## 2017-07-23 DIAGNOSIS — Z955 Presence of coronary angioplasty implant and graft: Secondary | ICD-10-CM | POA: Diagnosis not present

## 2017-07-23 DIAGNOSIS — Z48812 Encounter for surgical aftercare following surgery on the circulatory system: Secondary | ICD-10-CM | POA: Insufficient documentation

## 2017-07-23 DIAGNOSIS — I214 Non-ST elevation (NSTEMI) myocardial infarction: Secondary | ICD-10-CM | POA: Insufficient documentation

## 2017-07-25 ENCOUNTER — Encounter (HOSPITAL_COMMUNITY)
Admission: RE | Admit: 2017-07-25 | Discharge: 2017-07-25 | Disposition: A | Discharge status: Home / Self Care | Payer: POS | Source: Ambulatory Visit | Attending: Cardiology | Admitting: Cardiology

## 2017-07-25 DIAGNOSIS — I214 Non-ST elevation (NSTEMI) myocardial infarction: Secondary | ICD-10-CM | POA: Diagnosis not present

## 2017-07-25 DIAGNOSIS — Z955 Presence of coronary angioplasty implant and graft: Secondary | ICD-10-CM

## 2017-07-30 ENCOUNTER — Encounter (HOSPITAL_COMMUNITY): Payer: POS

## 2017-08-01 ENCOUNTER — Encounter (HOSPITAL_COMMUNITY): Payer: POS

## 2017-08-06 ENCOUNTER — Encounter (HOSPITAL_COMMUNITY)
Admission: RE | Admit: 2017-08-06 | Discharge: 2017-08-06 | Disposition: A | Discharge status: Home / Self Care | Payer: POS | Source: Ambulatory Visit | Attending: Cardiology | Admitting: Cardiology

## 2017-08-06 DIAGNOSIS — I214 Non-ST elevation (NSTEMI) myocardial infarction: Secondary | ICD-10-CM

## 2017-08-06 DIAGNOSIS — Z955 Presence of coronary angioplasty implant and graft: Secondary | ICD-10-CM

## 2017-08-08 ENCOUNTER — Encounter (HOSPITAL_COMMUNITY)
Admission: RE | Admit: 2017-08-08 | Discharge: 2017-08-08 | Disposition: A | Discharge status: Home / Self Care | Payer: POS | Source: Ambulatory Visit | Attending: Cardiology | Admitting: Cardiology

## 2017-08-08 DIAGNOSIS — I214 Non-ST elevation (NSTEMI) myocardial infarction: Secondary | ICD-10-CM | POA: Diagnosis not present

## 2017-08-08 DIAGNOSIS — Z955 Presence of coronary angioplasty implant and graft: Secondary | ICD-10-CM

## 2017-08-13 ENCOUNTER — Encounter (HOSPITAL_COMMUNITY): Payer: POS

## 2017-08-15 ENCOUNTER — Encounter (HOSPITAL_COMMUNITY): Payer: POS

## 2017-08-17 NOTE — Progress Notes (Addendum)
Cardiac Individual Treatment Plan  Patient Details  Name: Peter Lynn MRN: [id number] Date of Birth: 07-21-1951 Referring Provider:     CARDIAC REHAB PHASE II ORIENTATION from 05/03/2017 in [facility] CARDIAC REHAB  Referring Provider  [place], Peter MD      Initial Encounter Date:    CARDIAC REHAB PHASE II ORIENTATION from 05/03/2017 in [facility] CARDIAC REHAB  Date  05/03/17  Referring Provider  [place], Peter MD      Visit Diagnosis: Status post coronary artery stent placement  NSTEMI (non-ST elevated myocardial infarction) (HCC)  Patient's Home Medications on Admission:  Current Outpatient Medications:  .  aspirin 81 MG EC tablet, Take 1 tablet (81 mg total) by mouth daily., Disp: 30 tablet, Rfl: 11 .  atorvastatin (LIPITOR) 80 MG tablet, Take 1 tablet (80 mg total) by mouth daily at 6 PM., Disp: 90 tablet, Rfl: 1 .  metoprolol succinate (TOPROL-XL) 25 MG 24 hr tablet, Take 1 tablet (25 mg total) daily by mouth., Disp: 7 tablet, Rfl: 0 .  nitroGLYCERIN (NITROSTAT) 0.4 MG SL tablet, Place 1 tablet (0.4 mg total) under the tongue every 5 (five) minutes x 3 doses as needed for chest pain., Disp: 25 tablet, Rfl: 12 .  ticagrelor (BRILINTA) 90 MG TABS tablet, Take 1 tablet (90 mg total) by mouth 2 (two) times daily., Disp: 180 tablet, Rfl: 3 .  ZYRTEC-D ALLERGY & CONGESTION 5-120 MG tablet, Take 1 tablet by mouth daily., Disp: , Rfl: 0  Past Medical History: Past Medical History:  Diagnosis Date  . CAD (coronary artery disease)    a. cath 03/27/17 - ingle vessel occlusive CAD with thrombotic mid RCA occlusion s/p PTCA and DES. recomended femoral approach   . COPD (chronic obstructive pulmonary disease) (HCC)   . Glaucoma   . Hyperlipidemia LDL goal <70   . NSTEMI (non-ST elevated myocardial infarction) (HCC)     Tobacco Use: Social History   Tobacco Use  Smoking Status Former Smoker  Smokeless Tobacco Never Used  Tobacco Comment   QUIT  SMOKING OVER 30 YEARS AGO    Labs: Recent Review Flowsheet Data    Labs for ITP Cardiac and Pulmonary Rehab Latest Ref Rng & Units 03/27/2017 06/14/2017   Cholestrol 100 - 199 mg/dL 409 81(X)   LDLCALC 0 - 99 mg/dL 97 26   HDL >91 mg/dL 49 43   Trlycerides 0 - 149 mg/dL 45 478   Hemoglobin G9F % 5.9 -      Capillary Blood Glucose: No results found for: GLUCAP   Exercise Target Goals:    Exercise Program Goal: Individual exercise prescription set with THRR, safety & activity barriers. Participant demonstrates ability to understand and report RPE using BORG scale, to self-measure pulse accurately, and to acknowledge the importance of the exercise prescription.  Exercise Prescription Goal: Starting with aerobic activity 30 plus minutes a day, 3 days per week for initial exercise prescription. Provide home exercise prescription and guidelines that participant acknowledges understanding prior to discharge.  Activity Barriers & Risk Stratification: Activity Barriers & Cardiac Risk Stratification - 05/03/17 1407      Activity Barriers & Cardiac Risk Stratification   Activity Barriers  Deconditioning;Muscular Weakness;Other (comment)    Comments  occasional knee pain, R    Cardiac Risk Stratification  High       6 Minute Walk: 6 Minute Walk    Row Name 05/03/17 1556         6 Minute Walk  Phase  Initial     Distance  1734 feet     Walk Time  6 minutes     # of Rest Breaks  0     MPH  3.28     METS  3.89     RPE  11     Symptoms  No     Resting HR  63 bpm     Resting BP  110/74     Resting Oxygen Saturation   100 %     Exercise Oxygen Saturation  during 6 min walk  99 %     Max Ex. HR  108 bpm     Max Ex. BP  122/60     2 Minute Post BP  120/78        Oxygen Initial Assessment:   Oxygen Re-Evaluation:   Oxygen Discharge (Final Oxygen Re-Evaluation):   Initial Exercise Prescription: Initial Exercise Prescription - 05/03/17 1600      Date of Initial  Exercise RX and Referring Provider   Date  05/03/17    Referring Provider  [place], Peter MD      Bike   Level  0.8    Minutes  15    METs  2.89      NuStep   Level  3    SPM  80    Minutes  15    METs  2      Track   Laps  15    Minutes  15    METs  2.74      Prescription Details   Frequency (times per week)  3    Duration  Progress to 30 minutes of continuous aerobic without signs/symptoms of physical distress      Intensity   THRR 40-80% of Max Heartrate  61-122    Ratings of Perceived Exertion  11-13    Perceived Dyspnea  0-4      Progression   Progression  Continue to progress workloads to maintain intensity without signs/symptoms of physical distress.      Resistance Training   Training Prescription  Yes    Weight  3lbs    Reps  10-15       Perform Capillary Blood Glucose checks as needed.  Exercise Prescription Changes: Exercise Prescription Changes    Row Name 05/07/17 1645 05/29/17 1600 06/11/17 1600 07/04/17 1606 07/18/17 1516     Response to Exercise   Blood Pressure (Admit)  104/60  106/80  116/80  130/70  124/80   Blood Pressure (Exercise)  144/80  158/80  150/80  140/80  144/84   Blood Pressure (Exit)  122/70  130/80  114/70  134/70  112/60   Heart Rate (Admit)  64 bpm  61 bpm  60 bpm  51 bpm  62 bpm   Heart Rate (Exercise)  97 bpm  116 bpm  88 bpm  112 bpm  129 bpm   Heart Rate (Exit)  62 bpm  61 bpm  68 bpm  56 bpm  70 bpm   Rating of Perceived Exertion (Exercise)  11  12  11  12  12    Symptoms  none  none  none  none  none   Comments  pt was oriented to exercise equiment today  -  -  -  -   Duration  Continue with 30 min of aerobic exercise without signs/symptoms of physical distress.  Continue with 30 min of aerobic exercise without signs/symptoms of physical distress.  Continue with 30 min of aerobic exercise without signs/symptoms of physical distress.  Continue with 30 min of aerobic exercise without signs/symptoms of physical distress.   Continue with 30 min of aerobic exercise without signs/symptoms of physical distress.   Intensity  THRR unchanged  THRR unchanged  THRR unchanged  THRR unchanged  THRR unchanged     Progression   Progression  Continue to progress workloads to maintain intensity without signs/symptoms of physical distress.  Continue to progress workloads to maintain intensity without signs/symptoms of physical distress.  Continue to progress workloads to maintain intensity without signs/symptoms of physical distress.  Continue to progress workloads to maintain intensity without signs/symptoms of physical distress.  Continue to progress workloads to maintain intensity without signs/symptoms of physical distress.   Average METs  3.5  3.6  3.2  4.1  4.2     Resistance Training   Training Prescription  Yes  Yes  Yes  No Relaxation Day   No Relaxation Day    Weight  2lbs  3lbs  3lbs  -  -   Reps  10-15  10-15  10-15  -  -   Time  10 Minutes  10 Minutes  10 Minutes  -  -     Bike   Level  1.4  1.4  -  1.4  1.4   Minutes  15  15  -  15  15   METs  4.24  4.3  -  4.23  4.25     NuStep   Level  -  -  4  -  4   SPM  -  -  90  -  90   Minutes  -  -  15  -  15   METs  -  -  3.5  -  4.1     Track   Laps  15  16  15  28   -   Minutes  15  15  15  15   -   METs  2.74  2.86  2.74  4  -     Home Exercise Plan   Plans to continue exercise at  -  Home (comment) walking, 2(15'bouts)  Home (comment) walking, 2(15'bouts)  Home (comment) walking, 2(15'bouts)  Home (comment) walking, 2(15'bouts)   Frequency  -  Add 2 additional days to program exercise sessions.  Add 2 additional days to program exercise sessions.  Add 2 additional days to program exercise sessions.  Add 2 additional days to program exercise sessions.   Initial Home Exercises Provided  -  05/16/17  05/16/17  05/16/17  05/16/17   Row Name 07/25/17 0817 08/08/17 0815           Response to Exercise   Blood Pressure (Admit)  120/76  126/82      Blood  Pressure (Exercise)  140/80  130/76      Blood Pressure (Exit)  112/80  118/64      Heart Rate (Admit)  68 bpm  60 bpm      Heart Rate (Exercise)  101 bpm  113 bpm      Heart Rate (Exit)  77 bpm  65 bpm      Rating of Perceived Exertion (Exercise)  13  11      Symptoms  none  none      Duration  Continue with 30 min of aerobic exercise without signs/symptoms of physical distress.  Continue with 30 min of aerobic exercise without signs/symptoms  of physical distress.      Intensity  THRR unchanged  THRR unchanged        Progression   Progression  Continue to progress workloads to maintain intensity without signs/symptoms of physical distress.  Continue to progress workloads to maintain intensity without signs/symptoms of physical distress.      Average METs  3.8  4.3        Resistance Training   Training Prescription  No Relaxation Day   No Relaxation Day         NuStep   Level  5  5      SPM  95  100      Minutes  15  15      METs  4.9  5.3        Track   Laps  16  20      Minutes  15  15      METs  2.86  3.2        Home Exercise Plan   Plans to continue exercise at  Home (comment) walking, 2(15'bouts)  Home (comment) walking, 2(15'bouts)      Frequency  Add 2 additional days to program exercise sessions.  Add 2 additional days to program exercise sessions.      Initial Home Exercises Provided  05/16/17  05/16/17         Exercise Comments: Exercise Comments    Row Name 05/29/17 1648 06/20/17 1118 07/19/17 1523 08/09/17 0821     Exercise Comments  Reviewed METs and goals. Pt is tolerating exercise very well; will continue to monitor pt's progress and activity levels.   Reviewed METs and goals. Pt is tolerating exercise very well; will continue to monitor pt's progress and activity levels.   Reviewed METs and goals. Pt is tolerating exercise very well; will continue to monitor pt's progress and activity levels.   Reviewed METs and goals. Pt is tolerating exercise very well; will  continue to monitor pt's progress and activity levels.        Exercise Goals and Review: Exercise Goals    Row Name 05/03/17 1413             Exercise Goals   Increase Physical Activity  Yes return to yardwork       Intervention  Provide advice, education, support and counseling about physical activity/exercise needs.;Develop an individualized exercise prescription for aerobic and resistive training based on initial evaluation findings, risk stratification, comorbidities and participant's personal goals.       Expected Outcomes  Achievement of increased cardiorespiratory fitness and enhanced flexibility, muscular endurance and strength shown through measurements of functional capacity and personal statement of participant.       Increase Strength and Stamina  Yes learn activity/exercise limitations       Intervention  Provide advice, education, support and counseling about physical activity/exercise needs.;Develop an individualized exercise prescription for aerobic and resistive training based on initial evaluation findings, risk stratification, comorbidities and participant's personal goals.       Expected Outcomes  Achievement of increased cardiorespiratory fitness and enhanced flexibility, muscular endurance and strength shown through measurements of functional capacity and personal statement of participant.       Able to understand and use rate of perceived exertion (RPE) scale  Yes       Intervention  Provide education and explanation on how to use RPE scale       Expected Outcomes  Short Term: Able to use RPE daily in  rehab to express subjective intensity level;Long Term:  Able to use RPE to guide intensity level when exercising independently       Knowledge and understanding of Target Heart Rate Range (THRR)  Yes       Intervention  Provide education and explanation of THRR including how the numbers were predicted and where they are located for reference       Expected Outcomes  Short  Term: Able to state/look up THRR;Long Term: Able to use THRR to govern intensity when exercising independently;Short Term: Able to use daily as guideline for intensity in rehab       Able to check pulse independently  Yes       Intervention  Provide education and demonstration on how to check pulse in carotid and radial arteries.;Review the importance of being able to check your own pulse for safety during independent exercise       Expected Outcomes  Short Term: Able to explain why pulse checking is important during independent exercise;Long Term: Able to check pulse independently and accurately       Understanding of Exercise Prescription  Yes       Intervention  Provide education, explanation, and written materials on patient's individual exercise prescription       Expected Outcomes  Short Term: Able to explain program exercise prescription;Long Term: Able to explain home exercise prescription to exercise independently          Exercise Goals Re-Evaluation : Exercise Goals Re-Evaluation    Row Name 05/16/17 1446 05/29/17 1648 06/20/17 1118 07/19/17 1523 08/09/17 0820     Exercise Goal Re-Evaluation   Exercise Goals Review  Increase Physical Activity;Able to understand and use rate of perceived exertion (RPE) scale;Knowledge and understanding of Target Heart Rate Range (THRR);Understanding of Exercise Prescription;Increase Strength and Stamina;Able to check pulse independently  Increase Physical Activity;Able to understand and use rate of perceived exertion (RPE) scale;Knowledge and understanding of Target Heart Rate Range (THRR);Understanding of Exercise Prescription;Increase Strength and Stamina;Able to check pulse independently  Increase Physical Activity;Able to understand and use rate of perceived exertion (RPE) scale;Knowledge and understanding of Target Heart Rate Range (THRR);Understanding of Exercise Prescription;Increase Strength and Stamina;Able to check pulse independently  Increase  Physical Activity;Able to understand and use rate of perceived exertion (RPE) scale;Knowledge and understanding of Target Heart Rate Range (THRR);Understanding of Exercise Prescription;Increase Strength and Stamina;Able to check pulse independently  Increase Physical Activity;Able to understand and use rate of perceived exertion (RPE) scale;Knowledge and understanding of Target Heart Rate Range (THRR);Understanding of Exercise Prescription;Increase Strength and Stamina;Able to check pulse independently   Comments  Reviewed home exercise with pt today.  Pt plans to walk, 2(15') bouts for exercise.  Reviewed THR, pulse, RPE, sign and symptoms, NTG use, and when to call 911 or MD.  Also discussed weather considerations and indoor options.  Pt voiced understanding.  Pt is compliant with exercise and is walking up to 40 minutes on Tues/Thurs without difficulty or extreme SOB.  Pt is compliant with exercise and is walking 40-45 minutes on Tues/Thurs without difficulty or extreme SOB.  Pt is compliant with exercise and able to perform yard work without difficulity. Pt has a better understanding of exercise limitation.  Pt has returned to all normal and daily activities. Pt is advancing well with exercise prescriptions and understands activity limitations. Pt enjoys coming to cardiac rehab and finds it to be beneficial.    Expected Outcomes  Pt will be compliant with HEP and improve in  cardiorespiratory fitness  Pt will be compliant with HEP and improve in cardiorespiratory fitness  Pt will be compliant with HEP and improve in cardiorespiratory fitness  Pt will be compliant with HEP and improve in cardiorespiratory fitness  Pt will continue to improve in cardiorespiratory fitness and overall functional capacity.        Discharge Exercise Prescription (Final Exercise Prescription Changes): Exercise Prescription Changes - 08/08/17 0815      Response to Exercise   Blood Pressure (Admit)  126/82    Blood Pressure  (Exercise)  130/76    Blood Pressure (Exit)  118/64    Heart Rate (Admit)  60 bpm    Heart Rate (Exercise)  113 bpm    Heart Rate (Exit)  65 bpm    Rating of Perceived Exertion (Exercise)  11    Symptoms  none    Duration  Continue with 30 min of aerobic exercise without signs/symptoms of physical distress.    Intensity  THRR unchanged      Progression   Progression  Continue to progress workloads to maintain intensity without signs/symptoms of physical distress.    Average METs  4.3      Resistance Training   Training Prescription  No Relaxation Day       NuStep   Level  5    SPM  100    Minutes  15    METs  5.3      Track   Laps  20    Minutes  15    METs  3.2      Home Exercise Plan   Plans to continue exercise at  Home (comment) walking, 2(15'bouts)    Frequency  Add 2 additional days to program exercise sessions.    Initial Home Exercises Provided  05/16/17       Nutrition:  Target Goals: Understanding of nutrition guidelines, daily intake of sodium 1500mg , cholesterol 200mg , calories 30% from fat and 7% or less from saturated fats, daily to have 5 or more servings of fruits and vegetables.  Biometrics: Pre Biometrics - 05/29/17 1359      Pre Biometrics   Height  5\' 9"  (1.753 m)    Weight  176 lb 12.9 oz (80.2 kg)    Waist Circumference  35 inches    Hip Circumference  37.5 inches    Waist to Hip Ratio  0.93 %    BMI (Calculated)  26.1    Triceps Skinfold  20 mm    % Body Fat  25.7 %    Grip Strength  42 kg    Flexibility  14 in    Single Leg Stand  7 seconds        Nutrition Therapy Plan and Nutrition Goals: Nutrition Therapy & Goals - 05/03/17 1542      Nutrition Therapy   Diet  Therapeutic Lifestyle Changes      Personal Nutrition Goals   Nutrition Goal  Pt to identify food quantities necessary to achieve weight loss of 6-15 lb at graduation from cardiac rehab.       Intervention Plan   Intervention  Prescribe, educate and counsel regarding  individualized specific dietary modifications aiming towards targeted core components such as weight, hypertension, lipid management, diabetes, heart failure and other comorbidities.    Expected Outcomes  Short Term Goal: Understand basic principles of dietary content, such as calories, fat, sodium, cholesterol and nutrients.;Long Term Goal: Adherence to prescribed nutrition plan.       Nutrition  Discharge: Nutrition Scores: Nutrition Assessments - 05/03/17 1542      MEDFICTS Scores   Pre Score  30       Nutrition Goals Re-Evaluation:   Nutrition Goals Re-Evaluation:   Nutrition Goals Discharge (Final Nutrition Goals Re-Evaluation):   Psychosocial: Target Goals: Acknowledge presence or absence of significant depression and/or stress, maximize coping skills, provide positive support system. Participant is able to verbalize types and ability to use techniques and skills needed for reducing stress and depression.  Initial Review & Psychosocial Screening: Initial Psych Review & Screening - 05/03/17 1654      Initial Review   Current issues with  None Identified      Family Dynamics   Good Support System?  Yes      Barriers   Psychosocial barriers to participate in program  There are no identifiable barriers or psychosocial needs.      Screening Interventions   Interventions  Encouraged to exercise       Quality of Life Scores: Quality of Life - 05/07/17 1417      Quality of Life Scores   Health/Function Pre  28.33 %    Socioeconomic Pre  28.13 %    Psych/Spiritual Pre  30 %    Family Pre  27 %    GLOBAL Pre  28.43 % scores reviewed.  no concerns identified.        PHQ-9: Recent Review Flowsheet Data    Depression screen [facility] 2/9 05/07/2017 07/18/2015   Decreased Interest 0 0   Down, Depressed, Hopeless 0 0   PHQ - 2 Score 0 0     Interpretation of Total Score  Total Score Depression Severity:  1-4 = Minimal depression, 5-9 = Mild depression, 10-14 = Moderate  depression, 15-19 = Moderately severe depression, 20-27 = Severe depression   Psychosocial Evaluation and Intervention: Psychosocial Evaluation - 05/07/17 1414      Psychosocial Evaluation & Interventions   Interventions  Encouraged to exercise with the program and follow exercise prescription    Comments  no psychosocial needs identified, no interventions necessary     Expected Outcomes  pt will exhibit positive outlook with good coping skills.     Continue Psychosocial Services   No Follow up required       Psychosocial Re-Evaluation: Psychosocial Re-Evaluation    Row Name 05/29/17 1402 06/22/17 1215 07/17/17 1716 08/17/17 1631       Psychosocial Re-Evaluation   Current issues with  None Identified  None Identified  None Identified  None Identified    Comments  no psychsocial needs identified, no interventions necessary  no psychsocial needs identified, no interventions necessary  no psychsocial needs identified, no interventions necessary  no psychsocial needs identified, no interventions necessary    Expected Outcomes  pt will exhibit positive outlook with good coping skills.   pt will exhibit positive outlook with good coping skills.   pt will exhibit positive outlook with good coping skills.   pt will exhibit positive outlook with good coping skills.     Interventions  Encouraged to attend Cardiac Rehabilitation for the exercise  Encouraged to attend Cardiac Rehabilitation for the exercise  Encouraged to attend Cardiac Rehabilitation for the exercise  Encouraged to attend Cardiac Rehabilitation for the exercise    Continue Psychosocial Services   No Follow up required  No Follow up required  No Follow up required  No Follow up required       Psychosocial Discharge (Final Psychosocial Re-Evaluation): Psychosocial Re-Evaluation -  08/17/17 1631      Psychosocial Re-Evaluation   Current issues with  None Identified    Comments  no psychsocial needs identified, no interventions  necessary    Expected Outcomes  pt will exhibit positive outlook with good coping skills.     Interventions  Encouraged to attend Cardiac Rehabilitation for the exercise    Continue Psychosocial Services   No Follow up required       Vocational Rehabilitation: Provide vocational rehab assistance to qualifying candidates.   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 Participant will state understanding/return demonstration of topics presented.  Learning Barriers/Preferences: Learning Barriers/Preferences - 05/03/17 1406      Learning Barriers/Preferences   Learning Barriers  Sight    Learning Preferences  Written Material;Video;Pictoral       Education Topics: Count Your Pulse:  -Group instruction provided by verbal instruction, demonstration, patient participation and written materials to support subject.  Instructors address importance of being able to find your pulse and how to count your pulse when at home without a heart monitor.  Patients get hands on experience counting their pulse with staff help and individually.   Heart Attack, Angina, and Risk Factor Modification:  -Group instruction provided by verbal instruction, video, and written materials to support subject.  Instructors address signs and symptoms of angina and heart attacks.    Also discuss risk factors for heart disease and how to make changes to improve heart health risk factors.   Functional Fitness:  -Group instruction provided by verbal instruction, demonstration, patient participation, and written materials to support subject.  Instructors address safety measures for doing things around the house.  Discuss  how to get up and down off the floor, how to pick things up properly, how to safely get out of a chair without assistance, and balance training.   Meditation and Mindfulness:  -Group instruction provided by verbal instruction, patient participation, and written materials to support subject.  Instructor addresses importance of mindfulness and meditation practice to help reduce stress and improve awareness.  Instructor also leads participants through a meditation exercise.    Stretching for Flexibility and Mobility:  -Group instruction provided by verbal instruction, patient participation, and written materials to support subject.  Instructors lead participants through series of stretches that are designed to increase flexibility thus improving mobility.  These stretches are additional exercise for major muscle groups that are typically performed during regular warm up and cool down.   Hands Only CPR:  -Group verbal, video, and participation provides a basic overview of AHA guidelines for community CPR. Role-play of emergencies allow participants the opportunity to practice calling for help and chest compression technique with discussion of AED use.   Hypertension: -Group verbal and written instruction that provides a basic overview of hypertension including the most recent diagnostic guidelines, risk factor reduction with self-care instructions and medication management.    Nutrition I class: Heart Healthy Eating:  -Group instruction provided by PowerPoint slides, verbal discussion, and written materials to support subject matter. The instructor gives an explanation and review of the Therapeutic Lifestyle Changes diet recommendations, which includes a discussion on lipid goals, dietary fat, sodium, fiber, plant stanol/sterol esters, sugar, and the  components of a well-balanced, healthy diet.   CARDIAC REHAB PHASE II EXERCISE from 05/21/2017 in [facility] CARDIAC REHAB  Date   05/22/17  Educator  RD  Instruction Review Code  2- meets goals/outcomes      Nutrition II class: Lifestyle Skills:  -Group instruction provided by PowerPoint slides, verbal discussion, and written materials to support subject matter. The instructor gives an explanation and review of label reading, grocery shopping for heart health, heart healthy recipe modifications, and ways to make healthier choices when eating out.   Diabetes Question & Answer:  -Group instruction provided by PowerPoint slides, verbal discussion, and written materials to support subject matter. The instructor gives an explanation and review of diabetes co-morbidities, pre- and post-prandial blood glucose goals, pre-exercise blood glucose goals, signs, symptoms, and treatment of hypoglycemia and hyperglycemia, and foot care basics.   Diabetes Blitz:  -Group instruction provided by PowerPoint slides, verbal discussion, and written materials to support subject matter. The instructor gives an explanation and review of the physiology behind type 1 and type 2 diabetes, diabetes medications and rational behind using different medications, pre- and post-prandial blood glucose recommendations and Hemoglobin A1c goals, diabetes diet, and exercise including blood glucose guidelines for exercising safely.    Portion Distortion:  -Group instruction provided by PowerPoint slides, verbal discussion, written materials, and food models to support subject matter. The instructor gives an explanation of serving size versus portion size, changes in portions sizes over the last 20 years, and what consists of a serving from each food group.   Stress Management:  -Group instruction provided by verbal instruction, video, and written materials to support subject matter.  Instructors review role of stress in heart disease and how to cope with stress positively.     Exercising on Your Own:  -Group instruction provided by verbal instruction, power  point, and written materials to support subject.  Instructors discuss benefits of exercise, components of exercise, frequency and intensity of exercise, and end points for exercise.  Also discuss use of nitroglycerin and activating EMS.  Review options of places to exercise outside of rehab.  Review guidelines for sex with heart disease.   Cardiac Drugs I:  -Group instruction provided by verbal instruction and written materials to support subject.  Instructor reviews cardiac drug classes: antiplatelets, anticoagulants, beta blockers, and statins.  Instructor discusses reasons, side effects, and lifestyle considerations for each drug class.   Cardiac Drugs II:  -Group instruction provided by verbal instruction and written materials to support subject.  Instructor reviews cardiac drug classes: angiotensin converting enzyme inhibitors (ACE-I), angiotensin II receptor blockers (ARBs), nitrates, and calcium channel blockers.  Instructor discusses reasons, side effects, and lifestyle considerations for each drug class.   Anatomy and Physiology of the Circulatory System:  Group verbal and written instruction and models provide basic cardiac anatomy and physiology, with the coronary electrical and arterial systems. Review of: AMI, Angina, Valve disease, Heart Failure, Peripheral Artery Disease, Cardiac Arrhythmia, Pacemakers, and the ICD.   Other Education:  -Group or individual verbal, written, or video instructions that support the educational goals of the cardiac rehab program.   Knowledge Questionnaire Score: Knowledge Questionnaire Score - 05/03/17 1556      Knowledge Questionnaire Score   Pre Score  22/24       Core Components/Risk Factors/Patient Goals at Admission: Personal Goals and Risk Factors at Admission - 05/03/17 1615      Core Components/Risk Factors/Patient Goals on Admission   Lipids  Yes  Intervention  Provide education and support for participant on nutrition &  aerobic/resistive exercise along with prescribed medications to achieve LDL 70mg , HDL >40mg .    Expected Outcomes  Short Term: Participant states understanding of desired cholesterol values and is compliant with medications prescribed. Participant is following exercise prescription and nutrition guidelines.;Long Term: Cholesterol controlled with medications as prescribed, with individualized exercise RX and with personalized nutrition plan. Value goals: LDL < 70mg , HDL > 40 mg.       Core Components/Risk Factors/Patient Goals Review:  Goals and Risk Factor Review    Row Name 05/29/17 1400 06/22/17 1214 07/17/17 1714 08/17/17 1630       Core Components/Risk Factors/Patient Goals Review   Personal Goals Review  Lipids  Lipids  Lipids  Lipids    Review  pt with high lipids demonstrates eagerness to participate in CR exercise, nutrition and lifestyle modification education   pt with high lipids demonstrates eagerness to participate in CR exercise, nutrition and lifestyle modification education.  He is pleased to be free of cardiac symptoms.  pt walking at home.  pt with high lipids demonstrates eagerness to participate in CR exercise, nutrition and lifestyle modification education.  He is pleased to be free of cardiac symptoms.  pt walking at home which has weather and safety challenges.  pt is looking for indoor facility to walk.    pt with high lipids demonstrates eagerness to participate in CR exercise, nutrition and lifestyle modification education.  pt demonstrates compliance with lipid lowering recommendations.     Expected Outcomes  pt will participate in CR exercise,nutrtition and lifestyle modification to reduce overall RF.   pt will participate in CR exercise,nutrtition and lifestyle modification to reduce overall RF.   pt will participate in CR exercise,nutrtition and lifestyle modification to reduce overall RF.   pt will participate in CR exercise,nutrtition and lifestyle modification to reduce  overall RF.        Core Components/Risk Factors/Patient Goals at Discharge (Final Review):  Goals and Risk Factor Review - 08/17/17 1630      Core Components/Risk Factors/Patient Goals Review   Personal Goals Review  Lipids    Review  pt with high lipids demonstrates eagerness to participate in CR exercise, nutrition and lifestyle modification education.  pt demonstrates compliance with lipid lowering recommendations.     Expected Outcomes  pt will participate in CR exercise,nutrtition and lifestyle modification to reduce overall RF.        ITP Comments: ITP Comments    Row Name 05/03/17 1403 05/30/17 1551 06/22/17 1214 07/17/17 1710 08/17/17 1629   ITP Comments  Medical Director, Dr. Armanda Magic  30 day review.  pt with good attendance and participation.    30 day review.  pt with good attendance and participation.    30 day ITP review.  pt with good attendance and participation. pt exhibits positive interactions with staff and peers. pt expresses gratitude for care recieved.    30 day ITP review.  pt with good attendance and participation. pt exhibits positive interactions with staff and peers. pt expresses gratitude for care recieved.        Comments:

## 2017-08-20 ENCOUNTER — Encounter (HOSPITAL_COMMUNITY)
Admission: RE | Admit: 2017-08-20 | Discharge: 2017-08-20 | Disposition: A | Discharge status: Home / Self Care | Payer: POS | Source: Ambulatory Visit | Attending: Cardiology | Admitting: Cardiology

## 2017-08-20 DIAGNOSIS — Z955 Presence of coronary angioplasty implant and graft: Secondary | ICD-10-CM

## 2017-08-20 DIAGNOSIS — I214 Non-ST elevation (NSTEMI) myocardial infarction: Secondary | ICD-10-CM | POA: Diagnosis not present

## 2017-08-22 ENCOUNTER — Encounter (HOSPITAL_COMMUNITY)
Admission: RE | Admit: 2017-08-22 | Discharge: 2017-08-22 | Disposition: A | Discharge status: Home / Self Care | Payer: POS | Source: Ambulatory Visit | Attending: Cardiology | Admitting: Cardiology

## 2017-08-22 DIAGNOSIS — Z955 Presence of coronary angioplasty implant and graft: Secondary | ICD-10-CM | POA: Diagnosis not present

## 2017-08-22 DIAGNOSIS — Z48812 Encounter for surgical aftercare following surgery on the circulatory system: Secondary | ICD-10-CM | POA: Insufficient documentation

## 2017-08-22 DIAGNOSIS — I214 Non-ST elevation (NSTEMI) myocardial infarction: Secondary | ICD-10-CM | POA: Diagnosis present

## 2017-08-27 ENCOUNTER — Encounter (HOSPITAL_COMMUNITY)
Admission: RE | Admit: 2017-08-27 | Discharge: 2017-08-27 | Disposition: A | Discharge status: Home / Self Care | Payer: POS | Source: Ambulatory Visit | Attending: Cardiology | Admitting: Cardiology

## 2017-08-27 DIAGNOSIS — I214 Non-ST elevation (NSTEMI) myocardial infarction: Secondary | ICD-10-CM

## 2017-08-27 DIAGNOSIS — Z955 Presence of coronary angioplasty implant and graft: Secondary | ICD-10-CM

## 2017-08-29 ENCOUNTER — Encounter (HOSPITAL_COMMUNITY)
Admission: RE | Admit: 2017-08-29 | Discharge: 2017-08-29 | Disposition: A | Discharge status: Home / Self Care | Payer: POS | Source: Ambulatory Visit | Attending: Cardiology | Admitting: Cardiology

## 2017-08-29 DIAGNOSIS — Z955 Presence of coronary angioplasty implant and graft: Secondary | ICD-10-CM

## 2017-08-29 DIAGNOSIS — I214 Non-ST elevation (NSTEMI) myocardial infarction: Secondary | ICD-10-CM | POA: Diagnosis not present

## 2017-09-03 ENCOUNTER — Encounter (HOSPITAL_COMMUNITY)
Admission: RE | Admit: 2017-09-03 | Discharge: 2017-09-03 | Disposition: A | Discharge status: Home / Self Care | Payer: POS | Source: Ambulatory Visit | Attending: Cardiology | Admitting: Cardiology

## 2017-09-03 VITALS — Ht 69.0 in | Wt 180.3 lb

## 2017-09-03 DIAGNOSIS — I214 Non-ST elevation (NSTEMI) myocardial infarction: Secondary | ICD-10-CM | POA: Diagnosis not present

## 2017-09-03 DIAGNOSIS — Z955 Presence of coronary angioplasty implant and graft: Secondary | ICD-10-CM

## 2017-09-05 ENCOUNTER — Encounter (HOSPITAL_COMMUNITY)
Admission: RE | Admit: 2017-09-05 | Discharge: 2017-09-05 | Disposition: A | Discharge status: Home / Self Care | Payer: POS | Source: Ambulatory Visit | Attending: Cardiology | Admitting: Cardiology

## 2017-09-05 DIAGNOSIS — I214 Non-ST elevation (NSTEMI) myocardial infarction: Secondary | ICD-10-CM

## 2017-09-05 DIAGNOSIS — Z955 Presence of coronary angioplasty implant and graft: Secondary | ICD-10-CM

## 2017-09-10 ENCOUNTER — Encounter (HOSPITAL_COMMUNITY)
Admission: RE | Admit: 2017-09-10 | Discharge: 2017-09-10 | Disposition: A | Discharge status: Home / Self Care | Payer: POS | Source: Ambulatory Visit | Attending: Cardiology | Admitting: Cardiology

## 2017-09-10 DIAGNOSIS — Z955 Presence of coronary angioplasty implant and graft: Secondary | ICD-10-CM

## 2017-09-10 DIAGNOSIS — I214 Non-ST elevation (NSTEMI) myocardial infarction: Secondary | ICD-10-CM

## 2017-09-11 ENCOUNTER — Encounter (HOSPITAL_COMMUNITY): Payer: Self-pay

## 2017-09-12 ENCOUNTER — Encounter (HOSPITAL_COMMUNITY)
Admission: RE | Admit: 2017-09-12 | Discharge: 2017-09-12 | Disposition: A | Discharge status: Home / Self Care | Payer: POS | Source: Ambulatory Visit | Attending: Cardiology | Admitting: Cardiology

## 2017-09-12 DIAGNOSIS — Z955 Presence of coronary angioplasty implant and graft: Secondary | ICD-10-CM

## 2017-09-12 DIAGNOSIS — I214 Non-ST elevation (NSTEMI) myocardial infarction: Secondary | ICD-10-CM | POA: Diagnosis not present

## 2017-09-12 NOTE — Progress Notes (Signed)
Cardiac Individual Treatment Plan  Patient Details  Name: Peter Lynn MRN: [id number] Date of Birth: 10/01/1950 Referring Provider:     CARDIAC REHAB PHASE II ORIENTATION from 05/03/2017 in [facility] CARDIAC REHAB  Referring Provider  [place], Peter MD      Initial Encounter Date:    CARDIAC REHAB PHASE II ORIENTATION from 05/03/2017 in [facility] CARDIAC REHAB  Date  05/03/17  Referring Provider  [place], Peter MD      Visit Diagnosis: Status post coronary artery stent placement  NSTEMI (non-ST elevated myocardial infarction) (HCC)  Patient's Home Medications on Admission:  Current Outpatient Medications:  .  aspirin 81 MG EC tablet, Take 1 tablet (81 mg total) by mouth daily., Disp: 30 tablet, Rfl: 11 .  atorvastatin (LIPITOR) 80 MG tablet, Take 1 tablet (80 mg total) by mouth daily at 6 PM., Disp: 90 tablet, Rfl: 1 .  metoprolol succinate (TOPROL-XL) 25 MG 24 hr tablet, Take 1 tablet (25 mg total) daily by mouth., Disp: 7 tablet, Rfl: 0 .  nitroGLYCERIN (NITROSTAT) 0.4 MG SL tablet, Place 1 tablet (0.4 mg total) under the tongue every 5 (five) minutes x 3 doses as needed for chest pain., Disp: 25 tablet, Rfl: 12 .  ticagrelor (BRILINTA) 90 MG TABS tablet, Take 1 tablet (90 mg total) by mouth 2 (two) times daily., Disp: 180 tablet, Rfl: 3 .  ZYRTEC-D ALLERGY & CONGESTION 5-120 MG tablet, Take 1 tablet by mouth daily., Disp: , Rfl: 0  Past Medical History: Past Medical History:  Diagnosis Date  . CAD (coronary artery disease)    a. cath 03/27/17 - ingle vessel occlusive CAD with thrombotic mid RCA occlusion s/p PTCA and DES. recomended femoral approach   . COPD (chronic obstructive pulmonary disease) (HCC)   . Glaucoma   . Hyperlipidemia LDL goal <70   . NSTEMI (non-ST elevated myocardial infarction) (HCC)     Tobacco Use: Social History   Tobacco Use  Smoking Status Former Smoker  Smokeless Tobacco Never Used  Tobacco Comment   QUIT  SMOKING OVER 30 YEARS AGO    Labs: Recent Review Flowsheet Data    Labs for ITP Cardiac and Pulmonary Rehab Latest Ref Rng & Units 03/27/2017 06/14/2017   Cholestrol 100 - 199 mg/dL 409 81(X)   LDLCALC 0 - 99 mg/dL 97 26   HDL >91 mg/dL 49 43   Trlycerides 0 - 149 mg/dL 45 478   Hemoglobin G9F % 5.9 -      Capillary Blood Glucose: No results found for: GLUCAP   Exercise Target Goals:    Exercise Program Goal: Individual exercise prescription set using results from initial 6 min walk test and THRR while considering  patient's activity barriers and safety.   Exercise Prescription Goal: Initial exercise prescription builds to 30-45 minutes a day of aerobic activity, 2-3 days per week.  Home exercise guidelines will be given to patient during program as part of exercise prescription that the participant will acknowledge.  Activity Barriers & Risk Stratification: Activity Barriers & Cardiac Risk Stratification - 05/03/17 1407      Activity Barriers & Cardiac Risk Stratification   Activity Barriers  Deconditioning;Muscular Weakness;Other (comment)    Comments  occasional knee pain, R    Cardiac Risk Stratification  High       6 Minute Walk: 6 Minute Walk    Row Name 05/03/17 1556 09/03/17 1646       6 Minute Walk   Phase  Initial  Discharge    Distance  1734 feet  1876 feet    Distance % Change  -  8.19 %    Distance Feet Change  -  142 ft    Walk Time  6 minutes  6 minutes    # of Rest Breaks  0  0    MPH  3.28  3.55    METS  3.89  4.05    RPE  11  11    VO2 Peak  -  14.19    Symptoms  No  No    Resting HR  63 bpm  60 bpm    Resting BP  110/74  118/82    Resting Oxygen Saturation   100 %  -    Exercise Oxygen Saturation  during 6 min walk  99 %  -    Max Ex. HR  108 bpm  96 bpm    Max Ex. BP  122/60  130/80    2 Minute Post BP  120/78  126/78       Oxygen Initial Assessment:   Oxygen Re-Evaluation:   Oxygen Discharge (Final Oxygen  Re-Evaluation):   Initial Exercise Prescription: Initial Exercise Prescription - 05/03/17 1600      Date of Initial Exercise RX and Referring Provider   Date  05/03/17    Referring Provider  [place], Peter MD      Bike   Level  0.8    Minutes  15    METs  2.89      NuStep   Level  3    SPM  80    Minutes  15    METs  2      Track   Laps  15    Minutes  15    METs  2.74      Prescription Details   Frequency (times per week)  3    Duration  Progress to 30 minutes of continuous aerobic without signs/symptoms of physical distress      Intensity   THRR 40-80% of Max Heartrate  61-122    Ratings of Perceived Exertion  11-13    Perceived Dyspnea  0-4      Progression   Progression  Continue to progress workloads to maintain intensity without signs/symptoms of physical distress.      Resistance Training   Training Prescription  Yes    Weight  3lbs    Reps  10-15       Perform Capillary Blood Glucose checks as needed.  Exercise Prescription Changes: Exercise Prescription Changes    Row Name 05/07/17 1645 05/29/17 1600 06/11/17 1600 07/04/17 1606 07/18/17 1516     Response to Exercise   Blood Pressure (Admit)  104/60  106/80  116/80  130/70  124/80   Blood Pressure (Exercise)  144/80  158/80  150/80  140/80  144/84   Blood Pressure (Exit)  122/70  130/80  114/70  134/70  112/60   Heart Rate (Admit)  64 bpm  61 bpm  60 bpm  51 bpm  62 bpm   Heart Rate (Exercise)  97 bpm  116 bpm  88 bpm  112 bpm  129 bpm   Heart Rate (Exit)  62 bpm  61 bpm  68 bpm  56 bpm  70 bpm   Rating of Perceived Exertion (Exercise)  11  12  11  12  12    Symptoms  none  none  none  none  none  Comments  pt was oriented to exercise equiment today  -  -  -  -   Duration  Continue with 30 min of aerobic exercise without signs/symptoms of physical distress.  Continue with 30 min of aerobic exercise without signs/symptoms of physical distress.  Continue with 30 min of aerobic exercise without  signs/symptoms of physical distress.  Continue with 30 min of aerobic exercise without signs/symptoms of physical distress.  Continue with 30 min of aerobic exercise without signs/symptoms of physical distress.   Intensity  THRR unchanged  THRR unchanged  THRR unchanged  THRR unchanged  THRR unchanged     Progression   Progression  Continue to progress workloads to maintain intensity without signs/symptoms of physical distress.  Continue to progress workloads to maintain intensity without signs/symptoms of physical distress.  Continue to progress workloads to maintain intensity without signs/symptoms of physical distress.  Continue to progress workloads to maintain intensity without signs/symptoms of physical distress.  Continue to progress workloads to maintain intensity without signs/symptoms of physical distress.   Average METs  3.5  3.6  3.2  4.1  4.2     Resistance Training   Training Prescription  Yes  Yes  Yes  No Relaxation Day   No Relaxation Day    Weight  2lbs  3lbs  3lbs  -  -   Reps  10-15  10-15  10-15  -  -   Time  10 Minutes  10 Minutes  10 Minutes  -  -     Bike   Level  1.4  1.4  -  1.4  1.4   Minutes  15  15  -  15  15   METs  4.24  4.3  -  4.23  4.25     NuStep   Level  -  -  4  -  4   SPM  -  -  90  -  90   Minutes  -  -  15  -  15   METs  -  -  3.5  -  4.1     Track   Laps  15  16  15  28   -   Minutes  15  15  15  15   -   METs  2.74  2.86  2.74  4  -     Home Exercise Plan   Plans to continue exercise at  -  Home (comment) walking, 2(15'bouts)  Home (comment) walking, 2(15'bouts)  Home (comment) walking, 2(15'bouts)  Home (comment) walking, 2(15'bouts)   Frequency  -  Add 2 additional days to program exercise sessions.  Add 2 additional days to program exercise sessions.  Add 2 additional days to program exercise sessions.  Add 2 additional days to program exercise sessions.   Initial Home Exercises Provided  -  05/16/17  05/16/17  05/16/17  05/16/17   Row Name  07/25/17 0817 08/08/17 0815 08/27/17 1600 09/12/17 1000       Response to Exercise   Blood Pressure (Admit)  120/76  126/82  136/80  120/80    Blood Pressure (Exercise)  140/80  130/76  134/82  158/80    Blood Pressure (Exit)  112/80  118/64  114/80  126/80    Heart Rate (Admit)  68 bpm  60 bpm  68 bpm  59 bpm    Heart Rate (Exercise)  101 bpm  113 bpm  115 bpm  115 bpm    Heart  Rate (Exit)  77 bpm  65 bpm  75 bpm  65 bpm    Rating of Perceived Exertion (Exercise)  13  11  12  11     Symptoms  none  none  none  none    Duration  Continue with 30 min of aerobic exercise without signs/symptoms of physical distress.  Continue with 30 min of aerobic exercise without signs/symptoms of physical distress.  Continue with 30 min of aerobic exercise without signs/symptoms of physical distress.  Continue with 30 min of aerobic exercise without signs/symptoms of physical distress.    Intensity  THRR unchanged  THRR unchanged  THRR unchanged  THRR unchanged      Progression   Progression  Continue to progress workloads to maintain intensity without signs/symptoms of physical distress.  Continue to progress workloads to maintain intensity without signs/symptoms of physical distress.  Continue to progress workloads to maintain intensity without signs/symptoms of physical distress.  Continue to progress workloads to maintain intensity without signs/symptoms of physical distress.    Average METs  3.8  4.3  4.1  -      Resistance Training   Training Prescription  No Relaxation Day   No Relaxation Day   Yes  Yes    Weight  -  -  5lbs  5lb    Reps  -  -  10-15  10-15    Time  -  -  10 Minutes  10 Minutes      Bike   Level  -  -  -  1.4    Minutes  -  -  -  15    METs  -  -  -  4.21      NuStep   Level  5  5  6   -    SPM  95  100  100  -    Minutes  15  15  15   -    METs  4.9  5.3  5.3  -      Track   Laps  16  20  16  pt was interrupted a few times on the walking track  18    Minutes  15  15  15  15      METs  2.86  3.2  2.86  4.48      Home Exercise Plan   Plans to continue exercise at  Home (comment) walking, 2(15'bouts)  Home (comment) walking, 2(15'bouts)  Home (comment) walking, 2(15'bouts)  Home (comment)    Frequency  Add 2 additional days to program exercise sessions.  Add 2 additional days to program exercise sessions.  Add 2 additional days to program exercise sessions.  Add 2 additional days to program exercise sessions.    Initial Home Exercises Provided  05/16/17  05/16/17  05/16/17  05/16/17 05/16/2017       Exercise Comments: Exercise Comments    Row Name 05/29/17 1648 06/20/17 1118 07/19/17 1523 08/09/17 0821 09/03/17 1635   Exercise Comments  Reviewed METs and goals. Pt is tolerating exercise very well; will continue to monitor pt's progress and activity levels.   Reviewed METs and goals. Pt is tolerating exercise very well; will continue to monitor pt's progress and activity levels.   Reviewed METs and goals. Pt is tolerating exercise very well; will continue to monitor pt's progress and activity levels.   Reviewed METs and goals. Pt is tolerating exercise very well; will continue to monitor pt's progress and activity levels.  Reviewed METs and goals. Pt is tolerating exercise very well; will continue to monitor pt's progress and activity levels.       Exercise Goals and Review: Exercise Goals    Row Name 05/03/17 1413             Exercise Goals   Increase Physical Activity  Yes return to yardwork       Intervention  Provide advice, education, support and counseling about physical activity/exercise needs.;Develop an individualized exercise prescription for aerobic and resistive training based on initial evaluation findings, risk stratification, comorbidities and participant's personal goals.       Expected Outcomes  Achievement of increased cardiorespiratory fitness and enhanced flexibility, muscular endurance and strength shown through measurements of functional  capacity and personal statement of participant.       Increase Strength and Stamina  Yes learn activity/exercise limitations       Intervention  Provide advice, education, support and counseling about physical activity/exercise needs.;Develop an individualized exercise prescription for aerobic and resistive training based on initial evaluation findings, risk stratification, comorbidities and participant's personal goals.       Expected Outcomes  Achievement of increased cardiorespiratory fitness and enhanced flexibility, muscular endurance and strength shown through measurements of functional capacity and personal statement of participant.       Able to understand and use rate of perceived exertion (RPE) scale  Yes       Intervention  Provide education and explanation on how to use RPE scale       Expected Outcomes  Short Term: Able to use RPE daily in rehab to express subjective intensity level;Long Term:  Able to use RPE to guide intensity level when exercising independently       Knowledge and understanding of Target Heart Rate Range (THRR)  Yes       Intervention  Provide education and explanation of THRR including how the numbers were predicted and where they are located for reference       Expected Outcomes  Short Term: Able to state/look up THRR;Long Term: Able to use THRR to govern intensity when exercising independently;Short Term: Able to use daily as guideline for intensity in rehab       Able to check pulse independently  Yes       Intervention  Provide education and demonstration on how to check pulse in carotid and radial arteries.;Review the importance of being able to check your own pulse for safety during independent exercise       Expected Outcomes  Short Term: Able to explain why pulse checking is important during independent exercise;Long Term: Able to check pulse independently and accurately       Understanding of Exercise Prescription  Yes       Intervention  Provide education,  explanation, and written materials on patient's individual exercise prescription       Expected Outcomes  Short Term: Able to explain program exercise prescription;Long Term: Able to explain home exercise prescription to exercise independently          Exercise Goals Re-Evaluation : Exercise Goals Re-Evaluation    Row Name 05/16/17 1446 05/29/17 1648 06/20/17 1118 07/19/17 1523 08/09/17 0820     Exercise Goal Re-Evaluation   Exercise Goals Review  Increase Physical Activity;Able to understand and use rate of perceived exertion (RPE) scale;Knowledge and understanding of Target Heart Rate Range (THRR);Understanding of Exercise Prescription;Increase Strength and Stamina;Able to check pulse independently  Increase Physical Activity;Able to understand and use rate of perceived  exertion (RPE) scale;Knowledge and understanding of Target Heart Rate Range (THRR);Understanding of Exercise Prescription;Increase Strength and Stamina;Able to check pulse independently  Increase Physical Activity;Able to understand and use rate of perceived exertion (RPE) scale;Knowledge and understanding of Target Heart Rate Range (THRR);Understanding of Exercise Prescription;Increase Strength and Stamina;Able to check pulse independently  Increase Physical Activity;Able to understand and use rate of perceived exertion (RPE) scale;Knowledge and understanding of Target Heart Rate Range (THRR);Understanding of Exercise Prescription;Increase Strength and Stamina;Able to check pulse independently  Increase Physical Activity;Able to understand and use rate of perceived exertion (RPE) scale;Knowledge and understanding of Target Heart Rate Range (THRR);Understanding of Exercise Prescription;Increase Strength and Stamina;Able to check pulse independently   Comments  Reviewed home exercise with pt today.  Pt plans to walk, 2(15') bouts for exercise.  Reviewed THR, pulse, RPE, sign and symptoms, NTG use, and when to call 911 or MD.  Also  discussed weather considerations and indoor options.  Pt voiced understanding.  Pt is compliant with exercise and is walking up to 40 minutes on Tues/Thurs without difficulty or extreme SOB.  Pt is compliant with exercise and is walking 40-45 minutes on Tues/Thurs without difficulty or extreme SOB.  Pt is compliant with exercise and able to perform yard work without difficulity. Pt has a better understanding of exercise limitation.  Pt has returned to all normal and daily activities. Pt is advancing well with exercise prescriptions and understands activity limitations. Pt enjoys coming to cardiac rehab and finds it to be beneficial.    Expected Outcomes  Pt will be compliant with HEP and improve in cardiorespiratory fitness  Pt will be compliant with HEP and improve in cardiorespiratory fitness  Pt will be compliant with HEP and improve in cardiorespiratory fitness  Pt will be compliant with HEP and improve in cardiorespiratory fitness  Pt will continue to improve in cardiorespiratory fitness and overall functional capacity.    Row Name 09/03/17 1635             Exercise Goal Re-Evaluation   Exercise Goals Review  Increase Physical Activity;Able to understand and use rate of perceived exertion (RPE) scale;Knowledge and understanding of Target Heart Rate Range (THRR);Understanding of Exercise Prescription;Increase Strength and Stamina;Able to check pulse independently       Comments  Pt has increased walking capacity and exercise tolerance since starting the exercise program. Pt entry walk test was 1715ft and exit walk test is 1876. Pt stated feeling "good and strong." Pt is well aware of exercise/activity limitations but also understands that he can move and exercise to better strengthen his heart and respiratory system.        Expected Outcomes  Pt will continue to improve in cardiorespiratory fitness and overall functional capacity.            Discharge Exercise Prescription (Final Exercise  Prescription Changes): Exercise Prescription Changes - 09/12/17 1000      Response to Exercise   Blood Pressure (Admit)  120/80    Blood Pressure (Exercise)  158/80    Blood Pressure (Exit)  126/80    Heart Rate (Admit)  59 bpm    Heart Rate (Exercise)  115 bpm    Heart Rate (Exit)  65 bpm    Rating of Perceived Exertion (Exercise)  11    Symptoms  none    Duration  Continue with 30 min of aerobic exercise without signs/symptoms of physical distress.    Intensity  THRR unchanged      Progression  Progression  Continue to progress workloads to maintain intensity without signs/symptoms of physical distress.      Resistance Training   Training Prescription  Yes    Weight  5lb    Reps  10-15    Time  10 Minutes      Bike   Level  1.4    Minutes  15    METs  4.21      Track   Laps  18    Minutes  15    METs  4.48      Home Exercise Plan   Plans to continue exercise at  Home (comment)    Frequency  Add 2 additional days to program exercise sessions.    Initial Home Exercises Provided  05/16/17 05/16/2017       Nutrition:  Target Goals: Understanding of nutrition guidelines, daily intake of sodium 1500mg , cholesterol 200mg , calories 30% from fat and 7% or less from saturated fats, daily to have 5 or more servings of fruits and vegetables.  Biometrics: Pre Biometrics - 05/29/17 1359      Pre Biometrics   Height  5\' 9"  (1.753 m)    Weight  176 lb 12.9 oz (80.2 kg)    Waist Circumference  35 inches    Hip Circumference  37.5 inches    Waist to Hip Ratio  0.93 %    BMI (Calculated)  26.1    Triceps Skinfold  20 mm    % Body Fat  25.7 %    Grip Strength  42 kg    Flexibility  14 in    Single Leg Stand  7 seconds      Post Biometrics - 09/03/17 1647       Post  Biometrics   Height  5\' 9"  (1.753 m)    Weight  180 lb 5.4 oz (81.8 kg)    Waist Circumference  35 inches    Hip Circumference  37.5 inches    Waist to Hip Ratio  0.93 %    BMI (Calculated)  26.62     Triceps Skinfold  19 mm    % Body Fat  25.7 %    Grip Strength  45 kg    Flexibility  15 in    Single Leg Stand  15.09 seconds       Nutrition Therapy Plan and Nutrition Goals: Nutrition Therapy & Goals - 05/03/17 1542      Nutrition Therapy   Diet  Therapeutic Lifestyle Changes      Personal Nutrition Goals   Nutrition Goal  Pt to identify food quantities necessary to achieve weight loss of 6-15 lb at graduation from cardiac rehab.       Intervention Plan   Intervention  Prescribe, educate and counsel regarding individualized specific dietary modifications aiming towards targeted core components such as weight, hypertension, lipid management, diabetes, heart failure and other comorbidities.    Expected Outcomes  Short Term Goal: Understand basic principles of dietary content, such as calories, fat, sodium, cholesterol and nutrients.;Long Term Goal: Adherence to prescribed nutrition plan.       Nutrition Assessments: Nutrition Assessments - 05/03/17 1542      MEDFICTS Scores   Pre Score  30       Nutrition Goals Re-Evaluation:   Nutrition Goals Re-Evaluation:   Nutrition Goals Discharge (Final Nutrition Goals Re-Evaluation):   Psychosocial: Target Goals: Acknowledge presence or absence of significant depression and/or stress, maximize coping skills, provide positive support system. Participant is  able to verbalize types and ability to use techniques and skills needed for reducing stress and depression.  Initial Review & Psychosocial Screening: Initial Psych Review & Screening - 05/03/17 1654      Initial Review   Current issues with  None Identified      Family Dynamics   Good Support System?  Yes      Barriers   Psychosocial barriers to participate in program  There are no identifiable barriers or psychosocial needs.      Screening Interventions   Interventions  Encouraged to exercise       Quality of Life Scores: Quality of Life - 05/07/17 1417       Quality of Life Scores   Health/Function Pre  28.33 %    Socioeconomic Pre  28.13 %    Psych/Spiritual Pre  30 %    Family Pre  27 %    GLOBAL Pre  28.43 % scores reviewed.  no concerns identified.       Scores of 19 and below usually indicate a poorer quality of life in these areas.  A difference of  2-3 points is a clinically meaningful difference.  A difference of 2-3 points in the total score of the Quality of Life Index has been associated with significant improvement in overall quality of life, self-image, physical symptoms, and general health in studies assessing change in quality of life.  PHQ-9: Recent Review Flowsheet Data    Depression screen [facility] 2/9 05/07/2017 07/18/2015   Decreased Interest 0 0   Down, Depressed, Hopeless 0 0   PHQ - 2 Score 0 0     Interpretation of Total Score  Total Score Depression Severity:  1-4 = Minimal depression, 5-9 = Mild depression, 10-14 = Moderate depression, 15-19 = Moderately severe depression, 20-27 = Severe depression   Psychosocial Evaluation and Intervention: Psychosocial Evaluation - 05/07/17 1414      Psychosocial Evaluation & Interventions   Interventions  Encouraged to exercise with the program and follow exercise prescription    Comments  no psychosocial needs identified, no interventions necessary     Expected Outcomes  pt will exhibit positive outlook with good coping skills.     Continue Psychosocial Services   No Follow up required       Psychosocial Re-Evaluation: Psychosocial Re-Evaluation    Row Name 05/29/17 1402 06/22/17 1215 07/17/17 1716 08/17/17 1631 09/11/17 1545     Psychosocial Re-Evaluation   Current issues with  None Identified  None Identified  None Identified  None Identified  None Identified   Comments  no psychsocial needs identified, no interventions necessary  no psychsocial needs identified, no interventions necessary  no psychsocial needs identified, no interventions necessary  no psychsocial needs  identified, no interventions necessary  no psychsocial needs identified, no interventions necessary   Expected Outcomes  pt will exhibit positive outlook with good coping skills.   pt will exhibit positive outlook with good coping skills.   pt will exhibit positive outlook with good coping skills.   pt will exhibit positive outlook with good coping skills.   pt will exhibit positive outlook with good coping skills.    Interventions  Encouraged to attend Cardiac Rehabilitation for the exercise  Encouraged to attend Cardiac Rehabilitation for the exercise  Encouraged to attend Cardiac Rehabilitation for the exercise  Encouraged to attend Cardiac Rehabilitation for the exercise  Encouraged to attend Cardiac Rehabilitation for the exercise   Continue Psychosocial Services   No Follow up  required  No Follow up required  No Follow up required  No Follow up required  No Follow up required      Psychosocial Discharge (Final Psychosocial Re-Evaluation): Psychosocial Re-Evaluation - 09/11/17 1545      Psychosocial Re-Evaluation   Current issues with  None Identified    Comments  no psychsocial needs identified, no interventions necessary    Expected Outcomes  pt will exhibit positive outlook with good coping skills.     Interventions  Encouraged to attend Cardiac Rehabilitation for the exercise    Continue Psychosocial Services   No Follow up required       Vocational Rehabilitation: Provide vocational rehab assistance to qualifying candidates.   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 Participant will state understanding/return demonstration of topics presented.  Learning  Barriers/Preferences: Learning Barriers/Preferences - 05/03/17 1406      Learning Barriers/Preferences   Learning Barriers  Sight    Learning Preferences  Written Material;Video;Pictoral       Education Topics: Count Your Pulse:  -Group instruction provided by verbal instruction, demonstration, patient participation and written materials to support subject.  Instructors address importance of being able to find your pulse and how to count your pulse when at home without a heart monitor.  Patients get hands on experience counting their pulse with staff help and individually.   Heart Attack, Angina, and Risk Factor Modification:  -Group instruction provided by verbal instruction, video, and written materials to support subject.  Instructors address signs and symptoms of angina and heart attacks.    Also discuss risk factors for heart disease and how to make changes to improve heart health risk factors.   Functional Fitness:  -Group instruction provided by verbal instruction, demonstration, patient participation, and written materials to support subject.  Instructors address safety measures for doing things around the house.  Discuss how to get up and down off the floor, how to pick things up properly, how to safely get out of a chair without assistance, and balance training.   Meditation and Mindfulness:  -Group instruction provided by verbal instruction, patient participation, and written materials to support subject.  Instructor addresses importance of mindfulness and meditation practice to help reduce stress and improve awareness.  Instructor also leads participants through a meditation exercise.    Stretching for Flexibility and Mobility:  -Group instruction provided by verbal instruction, patient participation, and written materials to support subject.  Instructors lead participants through series of stretches that are designed to increase flexibility thus improving mobility.  These  stretches are additional exercise for major muscle groups that are typically performed during regular warm up and cool down.   Hands Only CPR:  -Group verbal, video, and participation provides a basic overview of AHA guidelines for community CPR. Role-play of emergencies allow participants the opportunity to practice calling for help and chest compression technique with discussion of AED use.   Hypertension: -Group verbal and written instruction that provides a basic overview of hypertension including the most recent diagnostic guidelines, risk factor reduction with self-care instructions and medication management.    Nutrition I class: Heart Healthy Eating:  -Group instruction provided by PowerPoint slides, verbal discussion, and written materials to  support subject matter. The instructor gives an explanation and review of the Therapeutic Lifestyle Changes diet recommendations, which includes a discussion on lipid goals, dietary fat, sodium, fiber, plant stanol/sterol esters, sugar, and the components of a well-balanced, healthy diet.   CARDIAC REHAB PHASE II EXERCISE from 05/21/2017 in [facility] CARDIAC REHAB  Date  05/22/17  Educator  RD  Instruction Review Code  2- meets goals/outcomes      Nutrition II class: Lifestyle Skills:  -Group instruction provided by PowerPoint slides, verbal discussion, and written materials to support subject matter. The instructor gives an explanation and review of label reading, grocery shopping for heart health, heart healthy recipe modifications, and ways to make healthier choices when eating out.   Diabetes Question & Answer:  -Group instruction provided by PowerPoint slides, verbal discussion, and written materials to support subject matter. The instructor gives an explanation and review of diabetes co-morbidities, pre- and post-prandial blood glucose goals, pre-exercise blood glucose goals, signs, symptoms, and treatment of  hypoglycemia and hyperglycemia, and foot care basics.   Diabetes Blitz:  -Group instruction provided by PowerPoint slides, verbal discussion, and written materials to support subject matter. The instructor gives an explanation and review of the physiology behind type 1 and type 2 diabetes, diabetes medications and rational behind using different medications, pre- and post-prandial blood glucose recommendations and Hemoglobin A1c goals, diabetes diet, and exercise including blood glucose guidelines for exercising safely.    Portion Distortion:  -Group instruction provided by PowerPoint slides, verbal discussion, written materials, and food models to support subject matter. The instructor gives an explanation of serving size versus portion size, changes in portions sizes over the last 20 years, and what consists of a serving from each food group.   Stress Management:  -Group instruction provided by verbal instruction, video, and written materials to support subject matter.  Instructors review role of stress in heart disease and how to cope with stress positively.     Exercising on Your Own:  -Group instruction provided by verbal instruction, power point, and written materials to support subject.  Instructors discuss benefits of exercise, components of exercise, frequency and intensity of exercise, and end points for exercise.  Also discuss use of nitroglycerin and activating EMS.  Review options of places to exercise outside of rehab.  Review guidelines for sex with heart disease.   Cardiac Drugs I:  -Group instruction provided by verbal instruction and written materials to support subject.  Instructor reviews cardiac drug classes: antiplatelets, anticoagulants, beta blockers, and statins.  Instructor discusses reasons, side effects, and lifestyle considerations for each drug class.   Cardiac Drugs II:  -Group instruction provided by verbal instruction and written materials to support subject.   Instructor reviews cardiac drug classes: angiotensin converting enzyme inhibitors (ACE-I), angiotensin II receptor blockers (ARBs), nitrates, and calcium channel blockers.  Instructor discusses reasons, side effects, and lifestyle considerations for each drug class.   Anatomy and Physiology of the Circulatory System:  Group verbal and written instruction and models provide basic cardiac anatomy and physiology, with the coronary electrical and arterial systems. Review of: AMI, Angina, Valve disease, Heart Failure, Peripheral Artery Disease, Cardiac Arrhythmia, Pacemakers, and the ICD.   Other Education:  -Group or individual verbal, written, or video instructions that support the educational goals of the cardiac rehab program.   Knowledge Questionnaire Score: Knowledge Questionnaire Score - 05/03/17 1556      Knowledge Questionnaire Score   Pre Score  22/24  Core Components/Risk Factors/Patient Goals at Admission: Personal Goals and Risk Factors at Admission - 05/03/17 1615      Core Components/Risk Factors/Patient Goals on Admission   Lipids  Yes    Intervention  Provide education and support for participant on nutrition & aerobic/resistive exercise along with prescribed medications to achieve LDL 70mg , HDL >40mg .    Expected Outcomes  Short Term: Participant states understanding of desired cholesterol values and is compliant with medications prescribed. Participant is following exercise prescription and nutrition guidelines.;Long Term: Cholesterol controlled with medications as prescribed, with individualized exercise RX and with personalized nutrition plan. Value goals: LDL < 70mg , HDL > 40 mg.       Core Components/Risk Factors/Patient Goals Review:  Goals and Risk Factor Review    Row Name 05/29/17 1400 06/22/17 1214 07/17/17 1714 08/17/17 1630 09/11/17 1544     Core Components/Risk Factors/Patient Goals Review   Personal Goals Review  Lipids  Lipids  Lipids  Lipids   Lipids   Review  pt with high lipids demonstrates eagerness to participate in CR exercise, nutrition and lifestyle modification education   pt with high lipids demonstrates eagerness to participate in CR exercise, nutrition and lifestyle modification education.  He is pleased to be free of cardiac symptoms.  pt walking at home.  pt with high lipids demonstrates eagerness to participate in CR exercise, nutrition and lifestyle modification education.  He is pleased to be free of cardiac symptoms.  pt walking at home which has weather and safety challenges.  pt is looking for indoor facility to walk.    pt with high lipids demonstrates eagerness to participate in CR exercise, nutrition and lifestyle modification education.  pt demonstrates compliance with lipid lowering recommendations.   pt with high lipids demonstrates eagerness to participate in CR exercise, nutrition and lifestyle modification education.  pt demonstrates compliance with lipid lowering recommendations.    Expected Outcomes  pt will participate in CR exercise,nutrtition and lifestyle modification to reduce overall RF.   pt will participate in CR exercise,nutrtition and lifestyle modification to reduce overall RF.   pt will participate in CR exercise,nutrtition and lifestyle modification to reduce overall RF.   pt will participate in CR exercise,nutrtition and lifestyle modification to reduce overall RF.   pt will participate in CR exercise,nutrtition and lifestyle modification to reduce overall RF.       Core Components/Risk Factors/Patient Goals at Discharge (Final Review):  Goals and Risk Factor Review - 09/11/17 1544      Core Components/Risk Factors/Patient Goals Review   Personal Goals Review  Lipids    Review  pt with high lipids demonstrates eagerness to participate in CR exercise, nutrition and lifestyle modification education.  pt demonstrates compliance with lipid lowering recommendations.     Expected Outcomes  pt will  participate in CR exercise,nutrtition and lifestyle modification to reduce overall RF.        ITP Comments: ITP Comments    Row Name 05/03/17 1403 05/30/17 1551 06/22/17 1214 07/17/17 1710 08/17/17 1629   ITP Comments  Medical Director, Dr. Armanda Magic  30 day review.  pt with good attendance and participation.    30 day review.  pt with good attendance and participation.    30 day ITP review.  pt with good attendance and participation. pt exhibits positive interactions with staff and peers. pt expresses gratitude for care recieved.    30 day ITP review.  pt with good attendance and participation. pt exhibits positive interactions with staff and  peers. pt expresses gratitude for care recieved.     Row Name 09/11/17 1543           ITP Comments  30 day ITP review.  pt with good attendance and participation. pt continues positive interactions with staff and peers. pt expresses gratitude for care recieved.            Comments:

## 2017-09-17 ENCOUNTER — Encounter (HOSPITAL_COMMUNITY)
Admission: RE | Admit: 2017-09-17 | Discharge: 2017-09-17 | Disposition: A | Discharge status: Home / Self Care | Payer: POS | Source: Ambulatory Visit | Attending: Cardiology | Admitting: Cardiology

## 2017-09-17 DIAGNOSIS — Z955 Presence of coronary angioplasty implant and graft: Secondary | ICD-10-CM

## 2017-09-17 DIAGNOSIS — I214 Non-ST elevation (NSTEMI) myocardial infarction: Secondary | ICD-10-CM

## 2017-09-19 ENCOUNTER — Encounter (HOSPITAL_COMMUNITY)
Admission: RE | Admit: 2017-09-19 | Discharge: 2017-09-19 | Disposition: A | Discharge status: Home / Self Care | Payer: POS | Source: Ambulatory Visit | Attending: Cardiology | Admitting: Cardiology

## 2017-09-19 DIAGNOSIS — I214 Non-ST elevation (NSTEMI) myocardial infarction: Secondary | ICD-10-CM | POA: Diagnosis not present

## 2017-09-19 DIAGNOSIS — Z955 Presence of coronary angioplasty implant and graft: Secondary | ICD-10-CM

## 2017-09-24 ENCOUNTER — Encounter (HOSPITAL_COMMUNITY): Payer: Self-pay

## 2017-09-24 ENCOUNTER — Encounter (HOSPITAL_COMMUNITY)
Admission: RE | Admit: 2017-09-24 | Discharge: 2017-09-24 | Disposition: A | Discharge status: Home / Self Care | Payer: POS | Source: Ambulatory Visit | Attending: Cardiology | Admitting: Cardiology

## 2017-09-24 DIAGNOSIS — I214 Non-ST elevation (NSTEMI) myocardial infarction: Secondary | ICD-10-CM | POA: Diagnosis not present

## 2017-09-24 DIAGNOSIS — Z48812 Encounter for surgical aftercare following surgery on the circulatory system: Secondary | ICD-10-CM | POA: Diagnosis not present

## 2017-09-24 DIAGNOSIS — Z955 Presence of coronary angioplasty implant and graft: Secondary | ICD-10-CM | POA: Insufficient documentation

## 2017-10-08 NOTE — Progress Notes (Signed)
Discharge Progress Report  Patient Details  Name: Peter Lynn MRN: [id number] Date of Birth: [birth year] Referring Provider:     [place] from 05/03/2017 in [place]  Referring Provider  [place], Peter MD       Number of Visits: 36   Reason for Discharge:  Patient has met program and personal goals.  Smoking History:  Social History   Tobacco Use  Smoking Status Former Smoker  Smokeless Tobacco Never Used  Tobacco Comment   QUIT SMOKING OVER 30 YEARS AGO    Diagnosis:  Status post coronary artery stent placement  NSTEMI (non-ST elevated myocardial infarction) ([place])  ADL [facility]:   Initial Exercise Prescription: Initial Exercise Prescription - 05/03/17 1600      Date of Initial Exercise RX and Referring Provider   Date  05/03/17    Referring Provider  [place], Peter MD      Bike   Level  0.8    Minutes  15    METs  2.89      NuStep   Level  3    SPM  80    Minutes  15    METs  2      Track   Laps  15    Minutes  15    METs  2.74      Prescription Details   Frequency (times per week)  3    Duration  Progress to 30 minutes of continuous aerobic without signs/symptoms of physical distress      Intensity   THRR 40-80% of Max Heartrate  61-122    Ratings of Perceived Exertion  11-13    Perceived Dyspnea  0-4      Progression   Progression  Continue to progress workloads to maintain intensity without signs/symptoms of physical distress.      Resistance Training   Training Prescription  Yes    Weight  3lbs    Reps  10-15       Discharge Exercise Prescription (Final Exercise Prescription Changes): Exercise Prescription Changes - 09/25/17 1600      Response to Exercise   Blood Pressure (Admit)  132/80    Blood Pressure (Exercise)  142/80    Blood Pressure (Exit)  138/70    Heart Rate (Admit)  56 bpm    Heart Rate (Exercise)  107 bpm    Heart Rate (Exit)  56 bpm    Rating of Perceived  Exertion (Exercise)  11    Symptoms  none    Duration  Continue with 30 min of aerobic exercise without signs/symptoms of physical distress.    Intensity  THRR unchanged      Progression   Progression  Continue to progress workloads to maintain intensity without signs/symptoms of physical distress.    Average METs  3.8      Resistance Training   Training Prescription  Yes    Weight  5lb    Reps  10-15    Time  10 Minutes      Bike   Level  1.4    Minutes  15    METs  4.21      Track   Laps  20    Minutes  15    METs  3.32      Home Exercise Plan   Plans to continue exercise at  Home (comment)    Frequency  Add 2 additional days to program exercise sessions.  Initial Home Exercises Provided  05/16/17 05/16/2017       Functional Capacity: 6 Minute Walk    Row Name 05/03/17 1556 09/03/17 1646       6 Minute Walk   Phase  Initial  Discharge    Distance  1734 feet  1876 feet    Distance % Change  -  8.19 %    Distance Feet Change  -  142 ft    Walk Time  6 minutes  6 minutes    # of Rest Breaks  0  0    MPH  3.28  3.55    METS  3.89  4.05    RPE  11  11    VO2 Peak  -  14.19    Symptoms  No  No    Resting HR  63 bpm  60 bpm    Resting BP  110/74  118/82    Resting Oxygen Saturation   100 %  -    Exercise Oxygen Saturation  during 6 min walk  99 %  -    Max Ex. HR  108 bpm  96 bpm    Max Ex. BP  122/60  130/80    2 Minute Post BP  120/78  126/78       Psychological, QOL, Others - Outcomes: PHQ 2/9: Depression screen [facility] 2/9 09/24/2017 05/07/2017 07/18/2015  Decreased Interest 0 0 0  Down, Depressed, Hopeless 0 0 0  PHQ - 2 Score 0 0 0    Quality of Life: Quality of Life - 09/24/17 1526      Quality of Life Scores   Health/Function Post  26.87 %    Socioeconomic Pre  28.13 %    Socioeconomic Post  26 %    Socioeconomic % Change   -7.57 %    Psych/Spiritual Pre  30 %    Psych/Spiritual Post  30 %    Psych/Spiritual % Change  0 %    Family Pre  27 %     Family Post  28.5 %    Family % Change  5.56 %    GLOBAL Pre  28.43 %    GLOBAL Post  27.57 %    GLOBAL % Change  -3.02 %       Personal Goals: Goals established at orientation with interventions provided to work toward goal. Personal Goals and Risk Factors at Admission - 05/03/17 1615      Core Components/Risk Factors/Patient Goals on Admission   Lipids  Yes    Intervention  Provide education and support for participant on nutrition & aerobic/resistive exercise along with prescribed medications to achieve LDL '70mg'$ , HDL >'40mg'$ .    Expected Outcomes  Short Term: Participant states understanding of desired cholesterol values and is compliant with medications prescribed. Participant is following exercise prescription and nutrition guidelines.;Long Term: Cholesterol controlled with medications as prescribed, with individualized exercise RX and with personalized nutrition plan. Value goals: LDL < '70mg'$ , HDL > 40 mg.        Personal Goals Discharge: Goals and Risk Factor Review    Row Name 05/29/17 1400 06/22/17 1214 07/17/17 1714 08/17/17 1630 09/11/17 1544     Core Components/Risk Factors/Patient Goals Review   Personal Goals Review  Lipids  Lipids  Lipids  Lipids  Lipids   Review  pt with high lipids demonstrates eagerness to participate in CR exercise, nutrition and lifestyle modification education   pt with high lipids demonstrates eagerness to participate in  CR exercise, nutrition and lifestyle modification education.  He is pleased to be free of cardiac symptoms.  pt walking at home.  pt with high lipids demonstrates eagerness to participate in CR exercise, nutrition and lifestyle modification education.  He is pleased to be free of cardiac symptoms.  pt walking at home which has weather and safety challenges.  pt is looking for indoor facility to walk.    pt with high lipids demonstrates eagerness to participate in CR exercise, nutrition and lifestyle modification education.  pt  demonstrates compliance with lipid lowering recommendations.   pt with high lipids demonstrates eagerness to participate in CR exercise, nutrition and lifestyle modification education.  pt demonstrates compliance with lipid lowering recommendations.    Expected Outcomes  pt will participate in CR exercise,nutrtition and lifestyle modification to reduce overall RF.   pt will participate in CR exercise,nutrtition and lifestyle modification to reduce overall RF.   pt will participate in CR exercise,nutrtition and lifestyle modification to reduce overall RF.   pt will participate in CR exercise,nutrtition and lifestyle modification to reduce overall RF.   pt will participate in CR exercise,nutrtition and lifestyle modification to reduce overall RF.    [place] Name 09/24/17 1520             Core Components/Risk Factors/Patient Goals Review   Review  pt completed 36 sessions with good attendance and participation. pt demonstrates proactiveness to make lifestyle modifications.  pt demonstrates compliance with lipid lowering recommendations.        Expected Outcomes  pt will participate in exercise,nutrtition and lifestyle modification to reduce overall RF.           Exercise Goals and Review: Exercise Goals    Row Name 05/03/17 1413             Exercise Goals   Increase Physical Activity  Yes return to yardwork       Intervention  Provide advice, education, support and counseling about physical activity/exercise needs.;Develop an individualized exercise prescription for aerobic and resistive training based on initial evaluation findings, risk stratification, comorbidities and participant's personal goals.       Expected Outcomes  Achievement of increased cardiorespiratory fitness and enhanced flexibility, muscular endurance and strength shown through measurements of functional capacity and personal statement of participant.       Increase Strength and Stamina  Yes learn activity/exercise limitations        Intervention  Provide advice, education, support and counseling about physical activity/exercise needs.;Develop an individualized exercise prescription for aerobic and resistive training based on initial evaluation findings, risk stratification, comorbidities and participant's personal goals.       Expected Outcomes  Achievement of increased cardiorespiratory fitness and enhanced flexibility, muscular endurance and strength shown through measurements of functional capacity and personal statement of participant.       Able to understand and use rate of perceived exertion (RPE) scale  Yes       Intervention  Provide education and explanation on how to use RPE scale       Expected Outcomes  Short Term: Able to use RPE daily in rehab to express subjective intensity level;Long Term:  Able to use RPE to guide intensity level when exercising independently       Knowledge and understanding of Target Heart Rate Range (THRR)  Yes       Intervention  Provide education and explanation of THRR including how the numbers were predicted and where they are located for reference  Expected Outcomes  Short Term: Able to state/look up THRR;Long Term: Able to use THRR to govern intensity when exercising independently;Short Term: Able to use daily as guideline for intensity in rehab       Able to check pulse independently  Yes       Intervention  Provide education and demonstration on how to check pulse in carotid and radial arteries.;Review the importance of being able to check your own pulse for safety during independent exercise       Expected Outcomes  Short Term: Able to explain why pulse checking is important during independent exercise;Long Term: Able to check pulse independently and accurately       Understanding of Exercise Prescription  Yes       Intervention  Provide education, explanation, and written materials on patient's individual exercise prescription       Expected Outcomes  Short Term: Able to  explain program exercise prescription;Long Term: Able to explain home exercise prescription to exercise independently          Nutrition & Weight - Outcomes: Pre Biometrics - 05/29/17 1359      Pre Biometrics   Height  '5\' 9"'$  (1.753 m)    Weight  176 lb 12.9 oz (80.2 kg)    Waist Circumference  35 inches    Hip Circumference  37.5 inches    Waist to Hip Ratio  0.93 %    BMI (Calculated)  26.1    Triceps Skinfold  20 mm    % Body Fat  25.7 %    Grip Strength  42 kg    Flexibility  14 in    Single Leg Stand  7 seconds      Post Biometrics - 09/03/17 1647       Post  Biometrics   Height  '5\' 9"'$  (1.753 m)    Weight  180 lb 5.4 oz (81.8 kg)    Waist Circumference  35 inches    Hip Circumference  37.5 inches    Waist to Hip Ratio  0.93 %    BMI (Calculated)  26.62    Triceps Skinfold  19 mm    % Body Fat  25.7 %    Grip Strength  45 kg    Flexibility  15 in    Single Leg Stand  15.09 seconds       Nutrition: Nutrition Therapy & Goals - 05/03/17 1542      Nutrition Therapy   Diet  Therapeutic Lifestyle Changes      Personal Nutrition Goals   Nutrition Goal  Pt to identify food quantities necessary to achieve weight loss of 6-15 lb at graduation from cardiac rehab.       Intervention Plan   Intervention  Prescribe, educate and counsel regarding individualized specific dietary modifications aiming towards targeted core components such as weight, hypertension, lipid management, diabetes, heart failure and other comorbidities.    Expected Outcomes  Short Term Goal: Understand basic principles of dietary content, such as calories, fat, sodium, cholesterol and nutrients.;Long Term Goal: Adherence to prescribed nutrition plan.       Nutrition Discharge: Nutrition Assessments - 09/28/17 1148      MEDFICTS Scores   Pre Score  30    Post Score  9    Score Difference  -21       Education Questionnaire Score: Knowledge Questionnaire Score - 09/24/17 1527      Knowledge  Questionnaire Score   Post Score  23/24  Goals reviewed with patient; copy given to patient.

## 2017-10-12 ENCOUNTER — Encounter (HOSPITAL_COMMUNITY): Payer: Self-pay

## 2017-10-12 NOTE — Progress Notes (Signed)
Discharge Progress Report  Patient Details  Name: Peter Lynn MRN: [id number] Date of Birth: 1950/09/10 Referring Provider:     [place] from 05/03/2017 in [place]  Referring Provider  Martin[place], Peter MD       Number of Visits: 36  Reason for Discharge:  Patient has met program and personal goals.  Smoking History:  Social History   Tobacco Use  Smoking Status Former Smoker  Smokeless Tobacco Never Used  Tobacco Comment   QUIT SMOKING OVER 30 YEARS AGO    Diagnosis:  Status post coronary artery stent placement  NSTEMI (non-ST elevated myocardial infarction) ([facility] [place])  ADL [facility]:   Initial Exercise Prescription: Initial Exercise Prescription - 05/03/17 1600      Date of Initial Exercise RX and Referring Provider   Date  05/03/17    Referring Provider  [place], Peter MD      Bike   Level  0.8    Minutes  15    METs  2.89      NuStep   Level  3    SPM  80    Minutes  15    METs  2      Track   Laps  15    Minutes  15    METs  2.74      Prescription Details   Frequency (times per week)  3    Duration  Progress to 30 minutes of continuous aerobic without signs/symptoms of physical distress      Intensity   THRR 40-80% of Max Heartrate  61-122    Ratings of Perceived Exertion  11-13    Perceived Dyspnea  0-4      Progression   Progression  Continue to progress workloads to maintain intensity without signs/symptoms of physical distress.      Resistance Training   Training Prescription  Yes    Weight  3lbs    Reps  10-15       Discharge Exercise Prescription (Final Exercise Prescription Changes): Exercise Prescription Changes - 09/25/17 1600      Response to Exercise   Blood Pressure (Admit)  132/80    Blood Pressure (Exercise)  142/80    Blood Pressure (Exit)  138/70    Heart Rate (Admit)  56 bpm    Heart Rate (Exercise)  107 bpm    Heart Rate (Exit)  56 bpm    Rating of Perceived  Exertion (Exercise)  11    Symptoms  none    Duration  Continue with 30 min of aerobic exercise without signs/symptoms of physical distress.    Intensity  THRR unchanged      Progression   Progression  Continue to progress workloads to maintain intensity without signs/symptoms of physical distress.    Average METs  3.8      Resistance Training   Training Prescription  Yes    Weight  5lb    Reps  10-15    Time  10 Minutes      Bike   Level  1.4    Minutes  15    METs  4.21      Track   Laps  20    Minutes  15    METs  3.32      Home Exercise Plan   Plans to continue exercise at  Home (comment)    Frequency  Add 2 additional days to program exercise sessions.    Initial  Home Exercises Provided  05/16/17 05/16/2017       Functional Capacity: 6 Minute Walk    Row Name 05/03/17 1556 09/03/17 1646       6 Minute Walk   Phase  Initial  Discharge    Distance  1734 feet  1876 feet    Distance % Change  -  8.19 %    Distance Feet Change  -  142 ft    Walk Time  6 minutes  6 minutes    # of Rest Breaks  0  0    MPH  3.28  3.55    METS  3.89  4.05    RPE  11  11    VO2 Peak  -  14.19    Symptoms  No  No    Resting HR  63 bpm  60 bpm    Resting BP  110/74  118/82    Resting Oxygen Saturation   100 %  -    Exercise Oxygen Saturation  during 6 min walk  99 %  -    Max Ex. HR  108 bpm  96 bpm    Max Ex. BP  122/60  130/80    2 Minute Post BP  120/78  126/78       Psychological, QOL, Others - Outcomes: PHQ 2/9: Depression screen [facility] 2/9 09/24/2017 05/07/2017 07/18/2015  Decreased Interest 0 0 0  Down, Depressed, Hopeless 0 0 0  PHQ - 2 Score 0 0 0    Quality of Life: Quality of Life - 09/24/17 1526      Quality of Life Scores   Health/Function Post  26.87 %    Socioeconomic Pre  28.13 %    Socioeconomic Post  26 %    Socioeconomic % Change   -7.57 %    Psych/Spiritual Pre  30 %    Psych/Spiritual Post  30 %    Psych/Spiritual % Change  0 %    Family Pre  27 %     Family Post  28.5 %    Family % Change  5.56 %    GLOBAL Pre  28.43 %    GLOBAL Post  27.57 %    GLOBAL % Change  -3.02 %       Personal Goals: Goals established at orientation with interventions provided to work toward goal. Personal Goals and Risk Factors at Admission - 05/03/17 1615      Core Components/Risk Factors/Patient Goals on Admission   Lipids  Yes    Intervention  Provide education and support for participant on nutrition & aerobic/resistive exercise along with prescribed medications to achieve LDL '70mg'$ , HDL >'40mg'$ .    Expected Outcomes  Short Term: Participant states understanding of desired cholesterol values and is compliant with medications prescribed. Participant is following exercise prescription and nutrition guidelines.;Long Term: Cholesterol controlled with medications as prescribed, with individualized exercise RX and with personalized nutrition plan. Value goals: LDL < '70mg'$ , HDL > 40 mg.        Personal Goals Discharge: Goals and Risk Factor Review    Row Name 05/29/17 1400 06/22/17 1214 07/17/17 1714 08/17/17 1630 09/11/17 1544     Core Components/Risk Factors/Patient Goals Review   Personal Goals Review  Lipids  Lipids  Lipids  Lipids  Lipids   Review  pt with high lipids demonstrates eagerness to participate in CR exercise, nutrition and lifestyle modification education   pt with high lipids demonstrates eagerness to participate in CR  exercise, nutrition and lifestyle modification education.  He is pleased to be free of cardiac symptoms.  pt walking at home.  pt with high lipids demonstrates eagerness to participate in CR exercise, nutrition and lifestyle modification education.  He is pleased to be free of cardiac symptoms.  pt walking at home which has weather and safety challenges.  pt is looking for indoor facility to walk.    pt with high lipids demonstrates eagerness to participate in CR exercise, nutrition and lifestyle modification education.  pt  demonstrates compliance with lipid lowering recommendations.   pt with high lipids demonstrates eagerness to participate in CR exercise, nutrition and lifestyle modification education.  pt demonstrates compliance with lipid lowering recommendations.    Expected Outcomes  pt will participate in CR exercise,nutrtition and lifestyle modification to reduce overall RF.   pt will participate in CR exercise,nutrtition and lifestyle modification to reduce overall RF.   pt will participate in CR exercise,nutrtition and lifestyle modification to reduce overall RF.   pt will participate in CR exercise,nutrtition and lifestyle modification to reduce overall RF.   pt will participate in CR exercise,nutrtition and lifestyle modification to reduce overall RF.    [place] Name 09/24/17 1520             Core Components/Risk Factors/Patient Goals Review   Review  pt completed 36 sessions with good attendance and participation. pt demonstrates proactiveness to make lifestyle modifications.  pt demonstrates compliance with lipid lowering recommendations.        Expected Outcomes  pt will participate in exercise,nutrtition and lifestyle modification to reduce overall RF.           Exercise Goals and Review: Exercise Goals    Row Name 05/03/17 1413             Exercise Goals   Increase Physical Activity  Yes return to yardwork       Intervention  Provide advice, education, support and counseling about physical activity/exercise needs.;Develop an individualized exercise prescription for aerobic and resistive training based on initial evaluation findings, risk stratification, comorbidities and participant's personal goals.       Expected Outcomes  Achievement of increased cardiorespiratory fitness and enhanced flexibility, muscular endurance and strength shown through measurements of functional capacity and personal statement of participant.       Increase Strength and Stamina  Yes learn activity/exercise limitations        Intervention  Provide advice, education, support and counseling about physical activity/exercise needs.;Develop an individualized exercise prescription for aerobic and resistive training based on initial evaluation findings, risk stratification, comorbidities and participant's personal goals.       Expected Outcomes  Achievement of increased cardiorespiratory fitness and enhanced flexibility, muscular endurance and strength shown through measurements of functional capacity and personal statement of participant.       Able to understand and use rate of perceived exertion (RPE) scale  Yes       Intervention  Provide education and explanation on how to use RPE scale       Expected Outcomes  Short Term: Able to use RPE daily in rehab to express subjective intensity level;Long Term:  Able to use RPE to guide intensity level when exercising independently       Knowledge and understanding of Target Heart Rate Range (THRR)  Yes       Intervention  Provide education and explanation of THRR including how the numbers were predicted and where they are located for reference  Expected Outcomes  Short Term: Able to state/look up THRR;Long Term: Able to use THRR to govern intensity when exercising independently;Short Term: Able to use daily as guideline for intensity in rehab       Able to check pulse independently  Yes       Intervention  Provide education and demonstration on how to check pulse in carotid and radial arteries.;Review the importance of being able to check your own pulse for safety during independent exercise       Expected Outcomes  Short Term: Able to explain why pulse checking is important during independent exercise;Long Term: Able to check pulse independently and accurately       Understanding of Exercise Prescription  Yes       Intervention  Provide education, explanation, and written materials on patient's individual exercise prescription       Expected Outcomes  Short Term: Able to  explain program exercise prescription;Long Term: Able to explain home exercise prescription to exercise independently          Nutrition & Weight - Outcomes: Pre Biometrics - 05/29/17 1359      Pre Biometrics   Height  '5\' 9"'$  (1.753 m)    Weight  176 lb 12.9 oz (80.2 kg)    Waist Circumference  35 inches    Hip Circumference  37.5 inches    Waist to Hip Ratio  0.93 %    BMI (Calculated)  26.1    Triceps Skinfold  20 mm    % Body Fat  25.7 %    Grip Strength  42 kg    Flexibility  14 in    Single Leg Stand  7 seconds      Post Biometrics - 09/03/17 1647       Post  Biometrics   Height  '5\' 9"'$  (1.753 m)    Weight  180 lb 5.4 oz (81.8 kg)    Waist Circumference  35 inches    Hip Circumference  37.5 inches    Waist to Hip Ratio  0.93 %    BMI (Calculated)  26.62    Triceps Skinfold  19 mm    % Body Fat  25.7 %    Grip Strength  45 kg    Flexibility  15 in    Single Leg Stand  15.09 seconds       Nutrition: Nutrition Therapy & Goals - 05/03/17 1542      Nutrition Therapy   Diet  Therapeutic Lifestyle Changes      Personal Nutrition Goals   Nutrition Goal  Pt to identify food quantities necessary to achieve weight loss of 6-15 lb at graduation from cardiac rehab.       Intervention Plan   Intervention  Prescribe, educate and counsel regarding individualized specific dietary modifications aiming towards targeted core components such as weight, hypertension, lipid management, diabetes, heart failure and other comorbidities.    Expected Outcomes  Short Term Goal: Understand basic principles of dietary content, such as calories, fat, sodium, cholesterol and nutrients.;Long Term Goal: Adherence to prescribed nutrition plan.       Nutrition Discharge: Nutrition Assessments - 09/28/17 1148      MEDFICTS Scores   Pre Score  30    Post Score  9    Score Difference  -21       Education Questionnaire Score: Knowledge Questionnaire Score - 09/24/17 1527      Knowledge  Questionnaire Score   Post Score  23/24  Goals reviewed with patient; copy given to patient.

## 2017-11-07 ENCOUNTER — Telehealth (HOSPITAL_COMMUNITY): Payer: Self-pay | Admitting: *Deleted

## 2017-11-20 IMAGING — CR DG KNEE 1-2V*L*
2 series · 2 of 2 positions shown · non-contrast
Comparison: 07/05/2015

CLINICAL DATA: Left knee pain

EXAM:
LEFT KNEE - 1-2 VIEW

[AP]
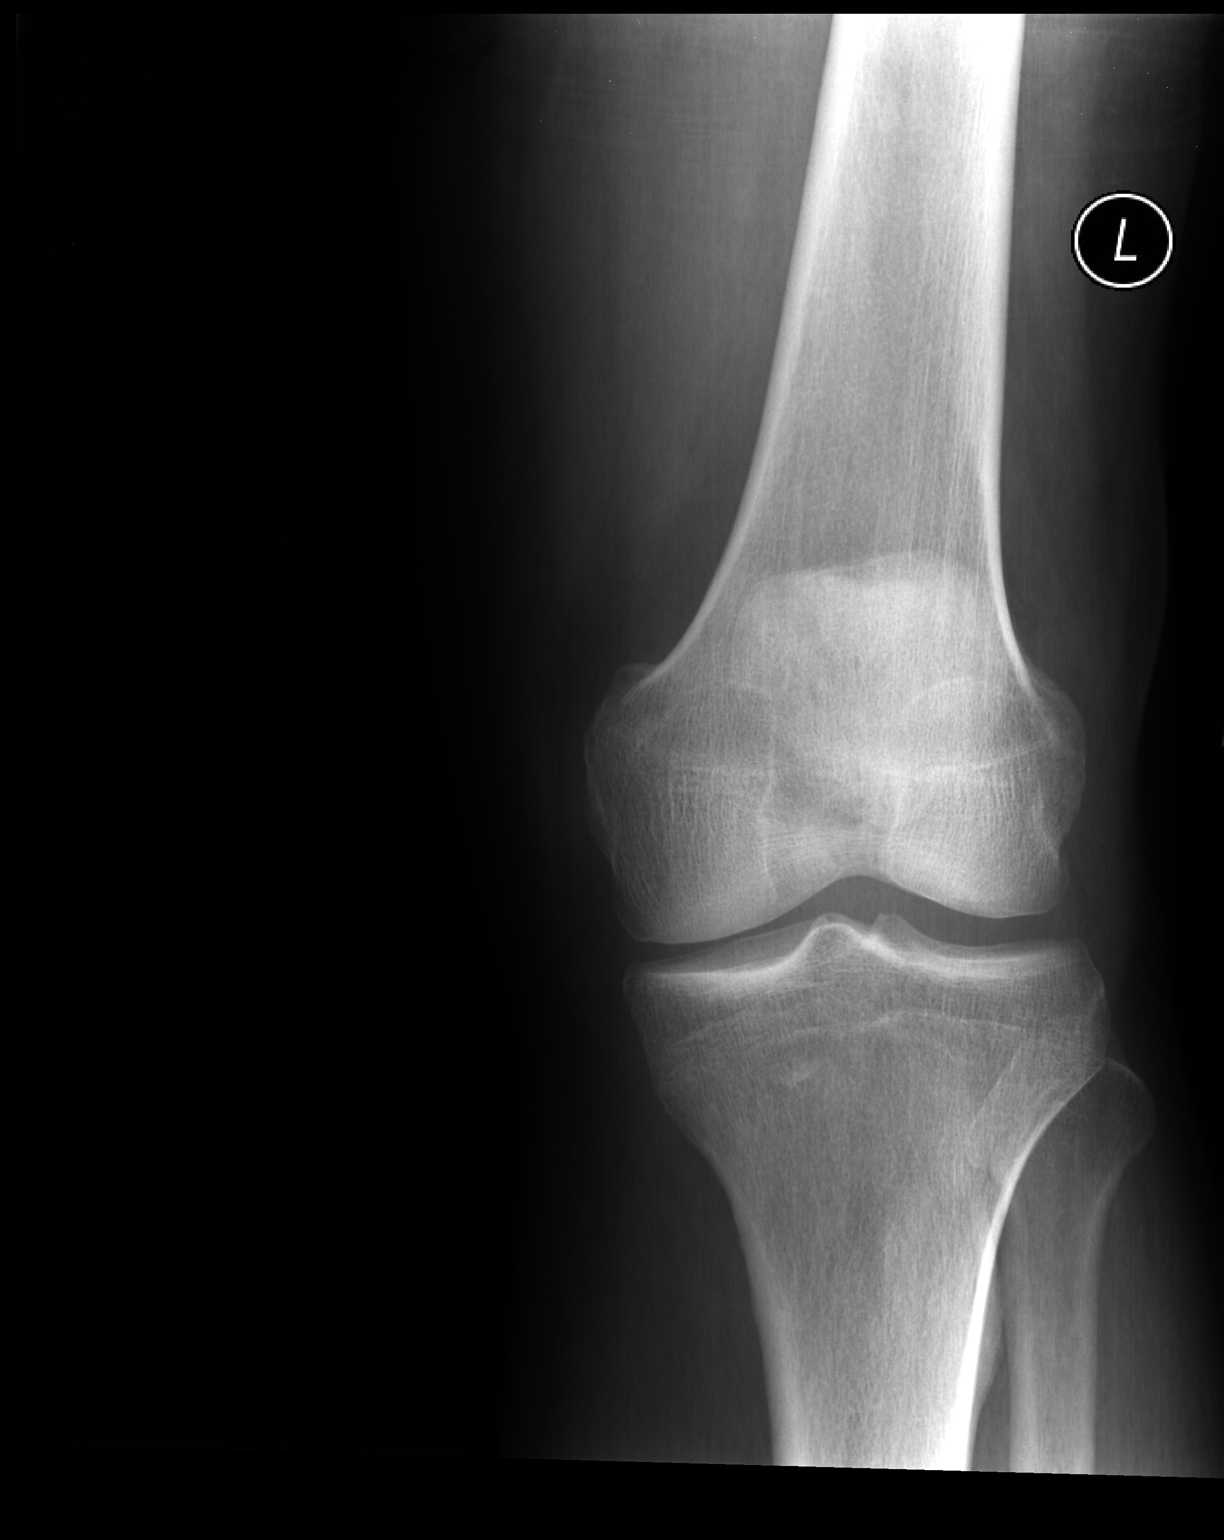

[lateral]
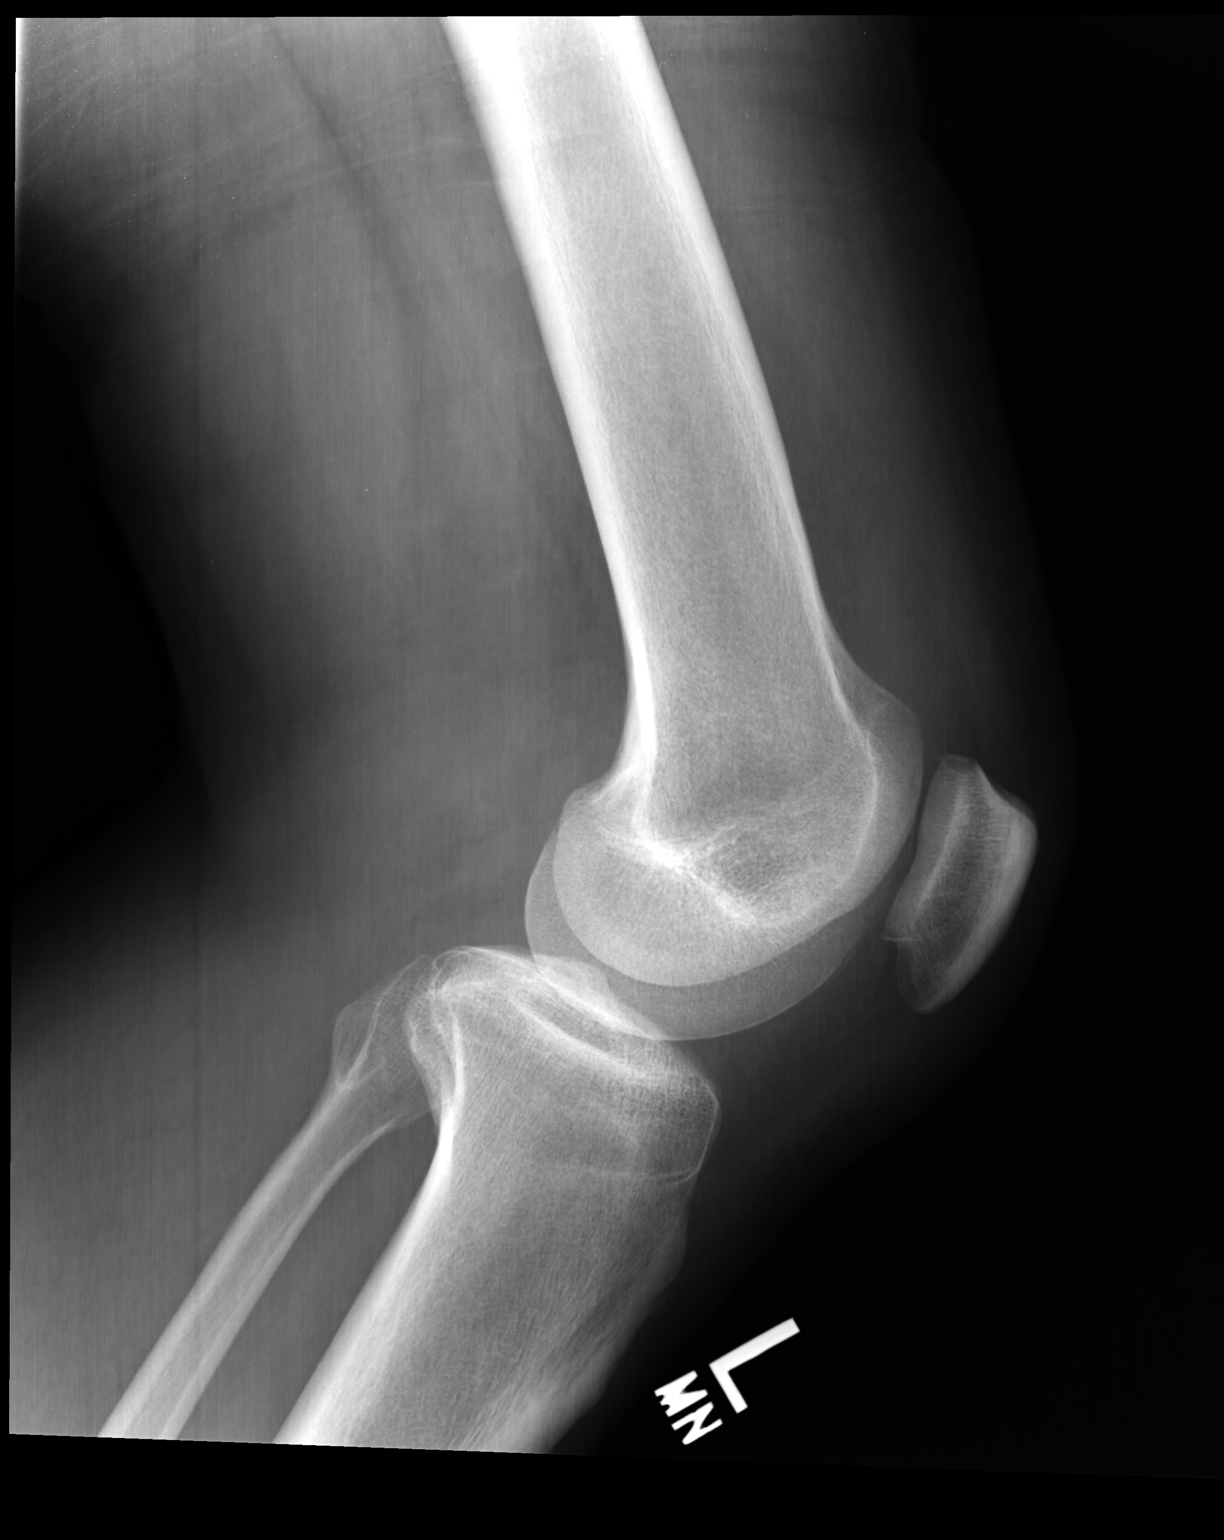

[2 of 2 positions shown; findings below may reference images not displayed]

FINDINGS: Two views of the left knee submitted. No acute fracture or
subluxation. Mild narrowing of medial joint compartment again noted.
Small joint effusion.
IMPRESSION: No acute fracture or subluxation. Mild narrowing of medial joint
compartment.

## 2017-11-29 ENCOUNTER — Other Ambulatory Visit: Payer: Self-pay | Admitting: Cardiology

## 2017-11-29 NOTE — Telephone Encounter (Signed)
REFILL 

## 2017-12-05 NOTE — Progress Notes (Signed)
Cardiology Office Note   Date:  12/06/2017   ID:  Peter Lynn, DOB Sep 22, 1950, MRN [id number]  PCP:  Patient, No Pcp Per  Cardiologist: Dr. Swaziland Chief Complaint  Patient presents with  . Follow-up  . Coronary Artery Disease  . Hypertension     History of Present Illness: Peter Lynn is a 67 y.o. male who presents for ongoing assessment and management of coronary artery disease, recent hospitalization in August 2018 with NSTEMI requiring cardiac catheterization revealing 100% second R PLB lesion, 100% mid RCA lesion drug-eluting stent 30% second diagonal, 50% mid to distal LAD lesion.  Echocardiogram revealed normal LV systolic function of 60-65% with mild MR.  The patient was started on metoprolol 25 mg daily DAPT with aspirin and Brilinta along with high-dose statin.  He was last seen by Dr. Swaziland on 06/14/2017 without any cardiac complaints is walking daily and going to cardiac rehab at that time. He is here today without any cardiac complaints. 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 He remains active. Has not had bleeding issues on Brilinta.    Past Medical History:  Diagnosis Date  . CAD (coronary artery disease)    a. cath 03/27/17 - ingle vessel occlusive CAD with thrombotic mid RCA occlusion s/p PTCA and DES. recomended femoral approach   . COPD (chronic obstructive pulmonary disease) (HCC)   . Glaucoma   . Hyperlipidemia LDL goal <70   . NSTEMI (non-ST elevated myocardial infarction) [facility])     Past Surgical History:  Procedure Laterality Date  . CARDIAC CATHETERIZATION    . CORONARY STENT INTERVENTION N/A 03/27/2017   Procedure: CORONARY STENT INTERVENTION;  Surgeon: Swaziland, Peter M, MD;  Location: [facility] INVASIVE CV LAB;  Service: Cardiovascular;  Laterality: N/A;  RCA  . LEFT HEART CATH AND CORONARY ANGIOGRAPHY N/A 03/27/2017   Procedure: LEFT HEART CATH AND CORONARY ANGIOGRAPHY;  Surgeon: Swaziland, Peter M, MD;  Location: [facility] INVASIVE CV  LAB;  Service: Cardiovascular;  Laterality: N/A;     Current Outpatient Medications  Medication Sig Dispense Refill  . aspirin 81 MG EC tablet Take 1 tablet (81 mg total) by mouth daily. 30 tablet 11  . atorvastatin (LIPITOR) 80 MG tablet Take 1 tablet (80 mg total) by mouth daily at 6 PM. KEEP OV. 90 tablet 0  . metoprolol succinate (TOPROL-XL) 25 MG 24 hr tablet Take 1 tablet (25 mg total) by mouth daily. KEEP OV. 90 tablet 0  . nitroGLYCERIN (NITROSTAT) 0.4 MG SL tablet Place 1 tablet (0.4 mg total) under the tongue every 5 (five) minutes x 3 doses as needed for chest pain. 25 tablet 12  . ticagrelor (BRILINTA) 90 MG TABS tablet Take 1 tablet (90 mg total) by mouth 2 (two) times daily. 180 tablet 3  . ZYRTEC-D ALLERGY & CONGESTION 5-120 MG tablet Take 1 tablet by mouth daily.  0   No current facility-administered medications for this visit.     Allergies:   Patient has no known allergies.    Social History:  The patient  reports that he has quit smoking. He has never used smokeless tobacco. He reports that he does not drink alcohol or use drugs.   Family History:  The patient's family history includes Heart attack (age of onset: 36) in his mother.    ROS: All other systems are reviewed and negative. Unless otherwise mentioned in H&P    PHYSICAL EXAM: VS:  BP 122/72   Pulse (!) 52  Ht 5\' 11"  (1.803 m)   Wt 179 lb 12.8 oz (81.6 kg)   BMI 25.08 kg/m  , BMI Body mass index is 25.08 kg/m. GEN: Well nourished, well developed, in no acute distress  HEENT: normal  Neck: no JVD, carotid bruits, or masses Cardiac: RRR; bradycardic , no murmurs, rubs, or gallops,no edema  Respiratory:  clear to auscultation bilaterally, normal work of breathing GI: soft, nontender, nondistended, + BS MS: no deformity or atrophy  Skin: warm and dry, no rash Neuro:  Strength and sensation are intact Psych: euthymic mood, full affect   EKG:  Sinus bradycardia, rate of 52 bpm. Non-specific ST T  wave abnormality. Essentially unchanged from previous EKG.   Recent Labs: 03/27/2017: TSH 1.993 03/28/2017: Hemoglobin 12.2; Platelets 168 06/14/2017: ALT 21; BUN 14; Creatinine, Ser 0.99; Potassium 4.3; Sodium 141    Lipid Panel    Component Value Date/Time   CHOL 93 (L) 06/14/2017 0948   TRIG 120 06/14/2017 0948   HDL 43 06/14/2017 0948   CHOLHDL 3.2 03/27/2017 1953   VLDL 9 03/27/2017 1953   LDLCALC 26 06/14/2017 0948      Wt Readings from Last 3 Encounters:  12/06/17 179 lb 12.8 oz (81.6 kg)  09/03/17 180 lb 5.4 oz (81.8 kg)  06/14/17 178 lb (80.7 kg)      Other studies Reviewed: Cath 03/27/2017 Conclusion     Mid LAD to Dist LAD lesion, 30 %stenosed.  2nd Diag lesion, 30 %stenosed.  2nd RPLB lesion, 100 %stenosed.  There is mild left ventricular systolic dysfunction.  LV end diastolic pressure is mildly elevated.  The left ventricular ejection fraction is 50-55% by visual estimate.  A STENT RESOLUTE ONYX 4.0X30 drug eluting stent was successfully placed.  Mid RCA lesion, 100 %stenosed.  Post intervention, there is a 0% residual stenosis.  1. 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 The RCA was a very large dominant vessel. Although there were some collaterals the RCA was incompletely filled.  2. Mild LV dysfunction with inferior hypokinesis. 3. Mildly elevated LVEDP 4. Successful stenting of the mid RCA with DES.  Plan: the patient's anatomy was very challenging from the right radial approach. The RCA arises anteriorly and has a downward takeoff. The LCA arises high in the cusp and was very difficult to engage. If Cardiac cath needed in the future I would consider a femoral approach. While there were some collaterals to the RCA I was concerned that these were inadequate and the RCA supplied a very large territory. There was also clearly viability of the inferior wall. Recommend DAPT for one year. Anticipate DC in am if stable.       Echo 04/04/2017 LV EF: 60% - 65%  Study Conclusions  - Left ventricle: The cavity size was normal. Wall thickness was increased in a pattern of mild LVH. Systolic function was normal. The estimated ejection fraction was in the range of 60% to 65%. Wall motion was normal; there were no regional wall motion abnormalities. Left ventricular diastolic function parameters were normal for the patient&'s age. - Mitral valve: There was mild regurgitation. - Right atrium: The atrium was mildly dilated.    ASSESSMENT AND PLAN:  1. CAD: Known stents to mid RCA lesion, 30% second diagonal, 50% mid to distal LAD lesion.  He is doing well and wishes to continue cardiac rehab. He will continue current medication regimen with DAPT BB and statin. Refills are provided. BMET and CBC are ordered.   2.  Hypertension: Well controlled today. No changes in regimen  3. Hypercholesterolemia: Continue statin therapy. Will check fasting lipids and LFT's.   Current medicines are reviewed at length with the patient today.    Labs/ tests ordered today include: BMET, L/L and CBC.   Bettey Mare. Liborio Nixon, ANP, AACC   12/06/2017 8:24 AM    [facility] [address] [place], [place] [address] Phone: [phone]; Fax: [phone]

## 2017-12-06 ENCOUNTER — Telehealth (HOSPITAL_COMMUNITY): Payer: Self-pay

## 2017-12-06 ENCOUNTER — Encounter: Payer: Self-pay | Admitting: Adult Health

## 2017-12-06 ENCOUNTER — Ambulatory Visit (INDEPENDENT_AMBULATORY_CARE_PROVIDER_SITE_OTHER): Payer: POS | Admitting: Adult Health

## 2017-12-06 VITALS — BP 122/72 | HR 52 | Ht 71.0 in | Wt 179.8 lb

## 2017-12-06 DIAGNOSIS — I10 Essential (primary) hypertension: Secondary | ICD-10-CM | POA: Diagnosis not present

## 2017-12-06 DIAGNOSIS — I2510 Atherosclerotic heart disease of native coronary artery without angina pectoris: Secondary | ICD-10-CM | POA: Diagnosis not present

## 2017-12-06 DIAGNOSIS — I214 Non-ST elevation (NSTEMI) myocardial infarction: Secondary | ICD-10-CM | POA: Diagnosis not present

## 2017-12-06 DIAGNOSIS — Z79899 Other long term (current) drug therapy: Secondary | ICD-10-CM

## 2017-12-06 DIAGNOSIS — E785 Hyperlipidemia, unspecified: Secondary | ICD-10-CM

## 2017-12-06 MED ORDER — METOPROLOL SUCCINATE ER 25 MG PO TB24: 25.0000 mg | ORAL_TABLET | Freq: Every day | ORAL | 3 refills | Status: DC

## 2017-12-06 MED ORDER — TICAGRELOR 90 MG PO TABS: 90.0000 mg | ORAL_TABLET | Freq: Two times a day (BID) | ORAL | 3 refills | Status: DC

## 2017-12-06 MED ORDER — ATORVASTATIN CALCIUM 80 MG PO TABS: 80.0000 mg | ORAL_TABLET | Freq: Every day | ORAL | 3 refills | Status: DC

## 2017-12-06 MED ORDER — NITROGLYCERIN 0.4 MG SL SUBL: 0.4000 mg | SUBLINGUAL_TABLET | SUBLINGUAL | 1 refills | Status: DC | PRN

## 2017-12-06 NOTE — Patient Instructions (Signed)
Medication Instructions:  NO CHANGES- Your physician recommends that you continue on your current medications as directed. Please refer to the Current Medication list given to you today.  If you need a refill on your cardiac medications before your next appointment, please call your pharmacy.  Labwork: LFR, FLP,BMET AND CBC TODAY HERE IN OUR OFFICE AT [facility]  Take the provided lab slips with you to the lab for your blood draw.   Special Instructions: OK TO START CARDIAC REHAB  Follow-Up: Your physician wants you to follow-up in: 6 MONTHS WITH DR SwazilandJORDAN You should receive a reminder letter in the mail two months in advance. If you do not receive a letter, please call our office 03-2018 to schedule the 05-2018 follow-up appointment.   Thank you for choosing [facility]!!

## 2017-12-06 NOTE — Addendum Note (Signed)
Addended by: Alyson InglesBROOME, MICHELLE L on: 12/06/2017 08:47 AM   Modules accepted: Orders

## 2017-12-06 NOTE — Telephone Encounter (Signed)
Patient completed Cardiac Rehab Undergrad program back in February. Patient is eligible to participate in the Maintenance program which our Maintenance Coordinator has sent and received Maintenance order signed by Dr.Jordan back in February. Closed referral.

## 2017-12-07 LAB — HEPATIC FUNCTION PANEL
ALT: 18 IU/L (ref 0–44)
AST: 33 IU/L (ref 0–40)
Albumin: 4.6 g/dL (ref 3.6–4.8)
Alkaline Phosphatase: 56 IU/L (ref 39–117)
BILIRUBIN, DIRECT: 0.17 mg/dL (ref 0.00–0.40)
Bilirubin Total: 0.5 mg/dL (ref 0.0–1.2)
Total Protein: 7.1 g/dL (ref 6.0–8.5)

## 2017-12-07 LAB — BASIC METABOLIC PANEL
BUN / CREAT RATIO: 14 (ref 10–24)
BUN: 14 mg/dL (ref 8–27)
CALCIUM: 9.3 mg/dL (ref 8.6–10.2)
CHLORIDE: 104 mmol/L (ref 96–106)
CO2: 24 mmol/L (ref 20–29)
Creatinine, Ser: 1.01 mg/dL (ref 0.76–1.27)
GFR, EST AFRICAN AMERICAN: 88 mL/min/{1.73_m2} (ref >59)
GFR, EST NON AFRICAN AMERICAN: 76 mL/min/{1.73_m2} (ref >59)
Glucose: 105 mg/dL — ABNORMAL HIGH (ref 65–99)
POTASSIUM: 4.9 mmol/L (ref 3.5–5.2)
SODIUM: 141 mmol/L (ref 134–144)

## 2017-12-07 LAB — CBC
Hematocrit: 36.6 % — ABNORMAL LOW (ref 37.5–51.0)
Hemoglobin: 12.2 g/dL — ABNORMAL LOW (ref 13.0–17.7)
MCH: 29 pg (ref 26.6–33.0)
MCHC: 33.3 g/dL (ref 31.5–35.7)
MCV: 87 fL (ref 79–97)
PLATELETS: 199 10*3/uL (ref 150–379)
RBC: 4.21 x10E6/uL (ref 4.14–5.80)
RDW: 14.4 % (ref 12.3–15.4)
WBC: 5.3 10*3/uL (ref 3.4–10.8)

## 2017-12-07 LAB — LIPID PANEL
CHOL/HDL RATIO: 1.9 ratio (ref 0.0–5.0)
CHOLESTEROL TOTAL: 98 mg/dL — AB (ref 100–199)
HDL: 51 mg/dL (ref >39)
LDL CALC: 35 mg/dL (ref 0–99)
TRIGLYCERIDES: 62 mg/dL (ref 0–149)
VLDL Cholesterol Cal: 12 mg/dL (ref 5–40)

## 2017-12-19 ENCOUNTER — Encounter (HOSPITAL_COMMUNITY)
Admission: RE | Admit: 2017-12-19 | Discharge: 2017-12-19 | Disposition: A | Discharge status: Home / Self Care | Payer: Self-pay | Source: Ambulatory Visit | Attending: Cardiology | Admitting: Cardiology

## 2017-12-24 ENCOUNTER — Encounter (HOSPITAL_COMMUNITY): Payer: Self-pay

## 2017-12-26 ENCOUNTER — Encounter (HOSPITAL_COMMUNITY): Payer: Self-pay

## 2017-12-31 ENCOUNTER — Encounter (HOSPITAL_COMMUNITY): Payer: Self-pay

## 2018-01-02 ENCOUNTER — Encounter (HOSPITAL_COMMUNITY): Payer: Self-pay

## 2018-01-07 ENCOUNTER — Encounter (HOSPITAL_COMMUNITY): Payer: Self-pay

## 2018-01-09 ENCOUNTER — Encounter (HOSPITAL_COMMUNITY): Payer: Self-pay

## 2018-01-16 ENCOUNTER — Encounter (HOSPITAL_COMMUNITY): Payer: Self-pay

## 2018-01-21 ENCOUNTER — Encounter (HOSPITAL_COMMUNITY): Payer: Self-pay

## 2018-01-21 DIAGNOSIS — I214 Non-ST elevation (NSTEMI) myocardial infarction: Secondary | ICD-10-CM | POA: Insufficient documentation

## 2018-01-23 ENCOUNTER — Encounter (HOSPITAL_COMMUNITY)
Admission: RE | Admit: 2018-01-23 | Discharge: 2018-01-23 | Disposition: A | Discharge status: Home / Self Care | Payer: Self-pay | Source: Ambulatory Visit | Attending: Cardiology | Admitting: Cardiology

## 2018-01-28 ENCOUNTER — Encounter (HOSPITAL_COMMUNITY): Payer: Self-pay

## 2018-01-30 ENCOUNTER — Encounter (HOSPITAL_COMMUNITY): Payer: Self-pay

## 2018-02-04 ENCOUNTER — Encounter (HOSPITAL_COMMUNITY): Payer: Self-pay

## 2018-02-06 ENCOUNTER — Encounter (HOSPITAL_COMMUNITY): Payer: Self-pay

## 2018-02-11 ENCOUNTER — Encounter (HOSPITAL_COMMUNITY): Payer: Self-pay

## 2018-02-13 ENCOUNTER — Encounter (HOSPITAL_COMMUNITY): Payer: Self-pay

## 2018-02-18 ENCOUNTER — Encounter (HOSPITAL_COMMUNITY): Payer: Self-pay | Attending: Cardiology

## 2018-02-18 DIAGNOSIS — I214 Non-ST elevation (NSTEMI) myocardial infarction: Secondary | ICD-10-CM | POA: Insufficient documentation

## 2018-02-20 ENCOUNTER — Encounter (HOSPITAL_COMMUNITY): Payer: Self-pay

## 2018-02-25 ENCOUNTER — Encounter (HOSPITAL_COMMUNITY): Payer: Self-pay

## 2018-02-27 ENCOUNTER — Encounter (HOSPITAL_COMMUNITY): Payer: Self-pay

## 2018-03-04 ENCOUNTER — Encounter (HOSPITAL_COMMUNITY): Payer: Self-pay

## 2018-03-06 ENCOUNTER — Encounter (HOSPITAL_COMMUNITY): Payer: Self-pay

## 2018-03-11 ENCOUNTER — Encounter (HOSPITAL_COMMUNITY): Payer: Self-pay

## 2018-03-13 ENCOUNTER — Encounter (HOSPITAL_COMMUNITY): Payer: Self-pay

## 2018-03-18 ENCOUNTER — Encounter (HOSPITAL_COMMUNITY): Payer: Self-pay

## 2018-03-20 ENCOUNTER — Encounter (HOSPITAL_COMMUNITY): Payer: Self-pay

## 2018-06-03 NOTE — Progress Notes (Signed)
Cardiology Office Note:    Date:  06/04/2018   ID:  Peter Lynn, DOB [birth year], MRN [id number]  PCP:  Patient, No Pcp Per  Cardiologist:  Peter Swaziland, MD   Referring MD: No ref. provider found   Chief Complaint  Patient presents with  . Follow-up    cad    History of Present Illness:    Peter Lynn is a 67 y.o. male with a hx of CAD and NSTEMI 03/2017. Heart cath revealed 100% RCA stenosis treated with DES. Echocardiogram at that time with normal LVEF and mild LVH. He was discharged on ASA and brilinta. He was last seen in clinic 12/06/17 and was doing well at that time.   He returns today for hospital follow up. He is still on DAPT. He is doing well without bleeding problems. He continues to work - is working 2 jobs that are physically demanding. He also walks daily. He is watching his diet and is complaint on his medications. No bleeding issues on ASA and brilinta.    Past Medical History:  Diagnosis Date  . CAD (coronary artery disease)    a. cath 03/27/17 - ingle vessel occlusive CAD with thrombotic mid RCA occlusion s/p PTCA and DES. recomended femoral approach   . COPD (chronic obstructive pulmonary disease) (HCC)   . Glaucoma   . Hyperlipidemia LDL goal <70   . NSTEMI (non-ST elevated myocardial infarction) [facility])     Past Surgical History:  Procedure Laterality Date  . CARDIAC CATHETERIZATION    . CORONARY STENT INTERVENTION N/A 03/27/2017   Procedure: CORONARY STENT INTERVENTION;  Surgeon: Swaziland, Peter M, MD;  Location: [facility] INVASIVE CV LAB;  Service: Cardiovascular;  Laterality: N/A;  RCA  . LEFT HEART CATH AND CORONARY ANGIOGRAPHY N/A 03/27/2017   Procedure: LEFT HEART CATH AND CORONARY ANGIOGRAPHY;  Surgeon: Swaziland, Peter M, MD;  Location: [facility] INVASIVE CV LAB;  Service: Cardiovascular;  Laterality: N/A;    Current Medications: Current Meds  Medication Sig  . aspirin 81 MG EC tablet Take 1 tablet (81 mg total) by mouth daily.  Marland Kitchen atorvastatin (LIPITOR) 80 MG  tablet Take 1 tablet (80 mg total) by mouth daily at 6 PM. KEEP OV.  . metoprolol succinate (TOPROL-XL) 25 MG 24 hr tablet Take 1 tablet (25 mg total) by mouth daily. KEEP OV.  . nitroGLYCERIN (NITROSTAT) 0.4 MG SL tablet Place 1 tablet (0.4 mg total) under the tongue every 5 (five) minutes x 3 doses as needed for chest pain.  Marland Kitchen ZYRTEC-D ALLERGY & CONGESTION 5-120 MG tablet Take 1 tablet by mouth daily.  . [DISCONTINUED] ticagrelor (BRILINTA) 90 MG TABS tablet Take 1 tablet (90 mg total) by mouth 2 (two) times daily.     Allergies:   Patient has no known allergies.   Social History   Socioeconomic History  . Marital status: Single    Spouse name: Not on file  . Number of children: Not on file  . Years of education: Not on file  . Highest education level: Not on file  Occupational History  . Not on file  Social Needs  . Financial resource strain: Not on file  . Food insecurity:    Worry: Not on file    Inability: Not on file  . Transportation needs:    Medical: Not on file    Non-medical: Not on file  Tobacco Use  . Smoking status: Former Games developer  . Smokeless tobacco: Never Used  . Tobacco comment: QUIT SMOKING OVER 30 YEARS  AGO  Substance and Sexual Activity  . Alcohol use: No    Alcohol/week: 0.0 standard drinks  . Drug use: No  . Sexual activity: Not on file  Lifestyle  . Physical activity:    Days per week: Not on file    Minutes per session: Not on file  . Stress: Not on file  Relationships  . Social connections:    Talks on phone: Not on file    Gets together: Not on file    Attends religious service: Not on file    Active member of club or organization: Not on file    Attends meetings of clubs or organizations: Not on file    Relationship status: Not on file  Other Topics Concern  . Not on file  Social History Narrative  . Not on file     Family History: The patient's family history includes Heart attack (age of onset: 70) in his mother.  ROS:   Please  see the history of present illness.     All other systems reviewed and are negative.  EKGs/Labs/Other Studies Reviewed:    The following studies were reviewed today:  Cath 03/27/2017 Conclusion     Mid LAD to Dist LAD lesion, 30 %stenosed.  2nd Diag lesion, 30 %stenosed.  2nd RPLB lesion, 100 %stenosed.  There is mild left ventricular systolic dysfunction.  LV end diastolic pressure is mildly elevated.  The left ventricular ejection fraction is 50-55% by visual estimate.  A STENT RESOLUTE ONYX 4.0X30 drug eluting stent was successfully placed.  Mid RCA lesion, 100 %stenosed.  Post intervention, there is a 0% residual stenosis.  1. 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 The RCA was a very large dominant vessel. Although there were some collaterals the RCA was incompletely filled.  2. Mild LV dysfunction with inferior hypokinesis. 3. Mildly elevated LVEDP 4. Successful stenting of the mid RCA with DES.  Plan: the patient's anatomy was very challenging from the right radial approach. The RCA arises anteriorly and has a downward takeoff. The LCA arises high in the cusp and was very difficult to engage. If Cardiac cath needed in the future I would consider a femoral approach. While there were some collaterals to the RCA I was concerned that these were inadequate and the RCA supplied a very large territory. There was also clearly viability of the inferior wall. Recommend DAPT for one year. Anticipate DC in am if stable.      Echo 04/04/2017 LV EF: 60% - 65%  Study Conclusions  - Left ventricle: The cavity size was normal. Wall thickness was increased in a pattern of mild LVH. Systolic function was normal. The estimated ejection fraction was in the range of 60% to 65%. Wall motion was normal; there were no regional wall motion abnormalities. Left ventricular diastolic function parameters were normal for the patient&'s age. - Mitral  valve: There was mild regurgitation. - Right atrium: The atrium was mildly dilated.   EKG:  EKG is not ordered today.    Recent Labs: 12/06/2017: ALT 18; BUN 14; Creatinine, Ser 1.01; Hemoglobin 12.2; Platelets 199; Potassium 4.9; Sodium 141  Recent Lipid Panel    Component Value Date/Time   CHOL 98 (L) 12/06/2017 0844   TRIG 62 12/06/2017 0844   HDL 51 12/06/2017 0844   CHOLHDL 1.9 12/06/2017 0844   CHOLHDL 3.2 03/27/2017 1953   VLDL 9 03/27/2017 1953   LDLCALC 35 12/06/2017 0844    Physical Exam:  VS:  BP 124/80   Pulse (!) 58   Ht 5\' 10"  (1.778 m)   Wt 179 lb 6.4 oz (81.4 kg)   BMI 25.74 kg/m     Wt Readings from Last 3 Encounters:  06/04/18 179 lb 6.4 oz (81.4 kg)  12/06/17 179 lb 12.8 oz (81.6 kg)  09/03/17 180 lb 5.4 oz (81.8 kg)     GEN: Well nourished, well developed in no acute distress HEENT: Normal NECK: No JVD; No carotid bruits CARDIAC: RRR, no murmurs, rubs, gallops RESPIRATORY:  Clear to auscultation without rales, wheezing or rhonchi  ABDOMEN: Soft, non-tender, non-distended MUSCULOSKELETAL:  No edema; No deformity  SKIN: Warm and dry NEUROLOGIC:  Alert and oriented x 3 PSYCHIATRIC:  Normal affect   ASSESSMENT:    1. NSTEMI (non-ST elevated myocardial infarction) (HCC)   2. Coronary artery disease involving native coronary artery of native heart without angina pectoris   3. Hyperlipidemia LDL goal <70    PLAN:    In order of problems listed above:  NSTEMI (non-ST elevated myocardial infarction) (HCC) Coronary artery disease involving native coronary artery of native heart without angina pectoris He is active every day and compliant on medications. He is asymptomatic. Pressure is well-controlled. No bleeding issues. Chart reviewed with Dr. Tresa Endo, DOD, who recommended continuing DAPT with 60 mg brilinta BID given his residual disease. If insurance or cost become a problem, OK to switch to plavix with follow up P2Y12 level.    Hyperlipidemia  LDL goal <70 12/06/2017: Cholesterol, Total 98; HDL 51; LDL Calculated 35; Triglycerides 62 Continue lipitor 80 mg.    Follow up in 1 year with Dr. Swaziland, sooner if needed.   Medication Adjustments/Labs and Tests Ordered: Current medicines are reviewed at length with the patient today.  Concerns regarding medicines are outlined above.  No orders of the defined types were placed in this encounter.  Meds ordered this encounter  Medications  . ticagrelor (BRILINTA) 60 MG TABS tablet    Sig: Take 1 tablet (60 mg total) by mouth 2 (two) times daily.    Dispense:  180 tablet    Refill:  3    Signed, Marcelino Duster, [place]  06/04/2018 8:32 AM     [facility]

## 2018-06-04 ENCOUNTER — Encounter: Payer: Self-pay | Admitting: Physician Assistant

## 2018-06-04 ENCOUNTER — Ambulatory Visit (INDEPENDENT_AMBULATORY_CARE_PROVIDER_SITE_OTHER): Payer: POS | Admitting: Physician Assistant

## 2018-06-04 VITALS — BP 124/80 | HR 58 | Ht 70.0 in | Wt 179.4 lb

## 2018-06-04 DIAGNOSIS — I214 Non-ST elevation (NSTEMI) myocardial infarction: Secondary | ICD-10-CM | POA: Diagnosis not present

## 2018-06-04 DIAGNOSIS — E785 Hyperlipidemia, unspecified: Secondary | ICD-10-CM | POA: Diagnosis not present

## 2018-06-04 DIAGNOSIS — I2510 Atherosclerotic heart disease of native coronary artery without angina pectoris: Secondary | ICD-10-CM | POA: Diagnosis not present

## 2018-06-04 MED ORDER — TICAGRELOR 60 MG PO TABS: 60.0000 mg | ORAL_TABLET | Freq: Two times a day (BID) | ORAL | 3 refills | Status: DC

## 2018-06-04 NOTE — Patient Instructions (Signed)
Medication Instructions:  After you finish the Brilinta 90 mg, begin Brilinta 60 mg twice daily.  If you need a refill on your cardiac medications before your next appointment, please call your pharmacy.   Lab work: None Ordered If you have labs (blood work) drawn today and your tests are completely normal, you will receive your results only by: Marland Kitchen MyChart Message (if you have MyChart) OR . A paper copy in the mail If you have any lab test that is abnormal or we need to change your treatment, we will call you to review the results.  Testing/Procedures: None Ordered.  Follow-Up: At [facility], you and your health needs are our priority.  As part of our continuing mission to provide you with exceptional heart care, we have created designated Provider Care Teams.  These Care Teams include your primary Cardiologist (physician) and Advanced Practice Providers (APPs -  Physician Assistants and Nurse Practitioners) who all work together to provide you with the care you need, when you need it. . You will need a follow up appointment in 1 year.  Please call our office 2 months in advance to schedule this appointment.  You may see Peter Swaziland, MD ONLY.  Any Other Special Instructions Will Be Listed Below (If Applicable). None

## 2019-02-01 ENCOUNTER — Other Ambulatory Visit: Payer: Self-pay | Admitting: Adult Health

## 2019-02-24 ENCOUNTER — Other Ambulatory Visit: Payer: Self-pay | Admitting: Physician Assistant

## 2019-02-25 NOTE — Telephone Encounter (Signed)
Rx request sent to pharmacy.  

## 2019-05-19 ENCOUNTER — Telehealth: Payer: Self-pay | Admitting: Cardiology

## 2019-05-19 NOTE — Telephone Encounter (Signed)
°*  STAT* If patient is at the pharmacy, call can be transferred to refill team.   1. Which medications need to be refilled? (please list name of each medication and dose if known)  Brilinta*  2. Which pharmacy/location (including street and city if local pharmacy) is medication to be sent to?  [facility] on [address], [place]  3. Do they need a 30 day or 90 day supply? *60 days and refills

## 2019-05-20 ENCOUNTER — Other Ambulatory Visit: Payer: Self-pay

## 2019-05-20 MED ORDER — TICAGRELOR 60 MG PO TABS: 60.0000 mg | ORAL_TABLET | Freq: Two times a day (BID) | ORAL | 3 refills | Status: DC

## 2019-05-23 ENCOUNTER — Other Ambulatory Visit (HOSPITAL_COMMUNITY): Payer: Self-pay

## 2019-05-23 MED ORDER — TICAGRELOR 60 MG PO TABS: 60.0000 mg | ORAL_TABLET | Freq: Two times a day (BID) | ORAL | 3 refills | Status: DC

## 2019-07-31 IMAGING — DX DG CHEST 2V
2 series · 2 of 2 positions shown · non-contrast
Comparison: None.

CLINICAL DATA: Shortness of breath.

EXAM:
CHEST  2 VIEW

[w chest pa]
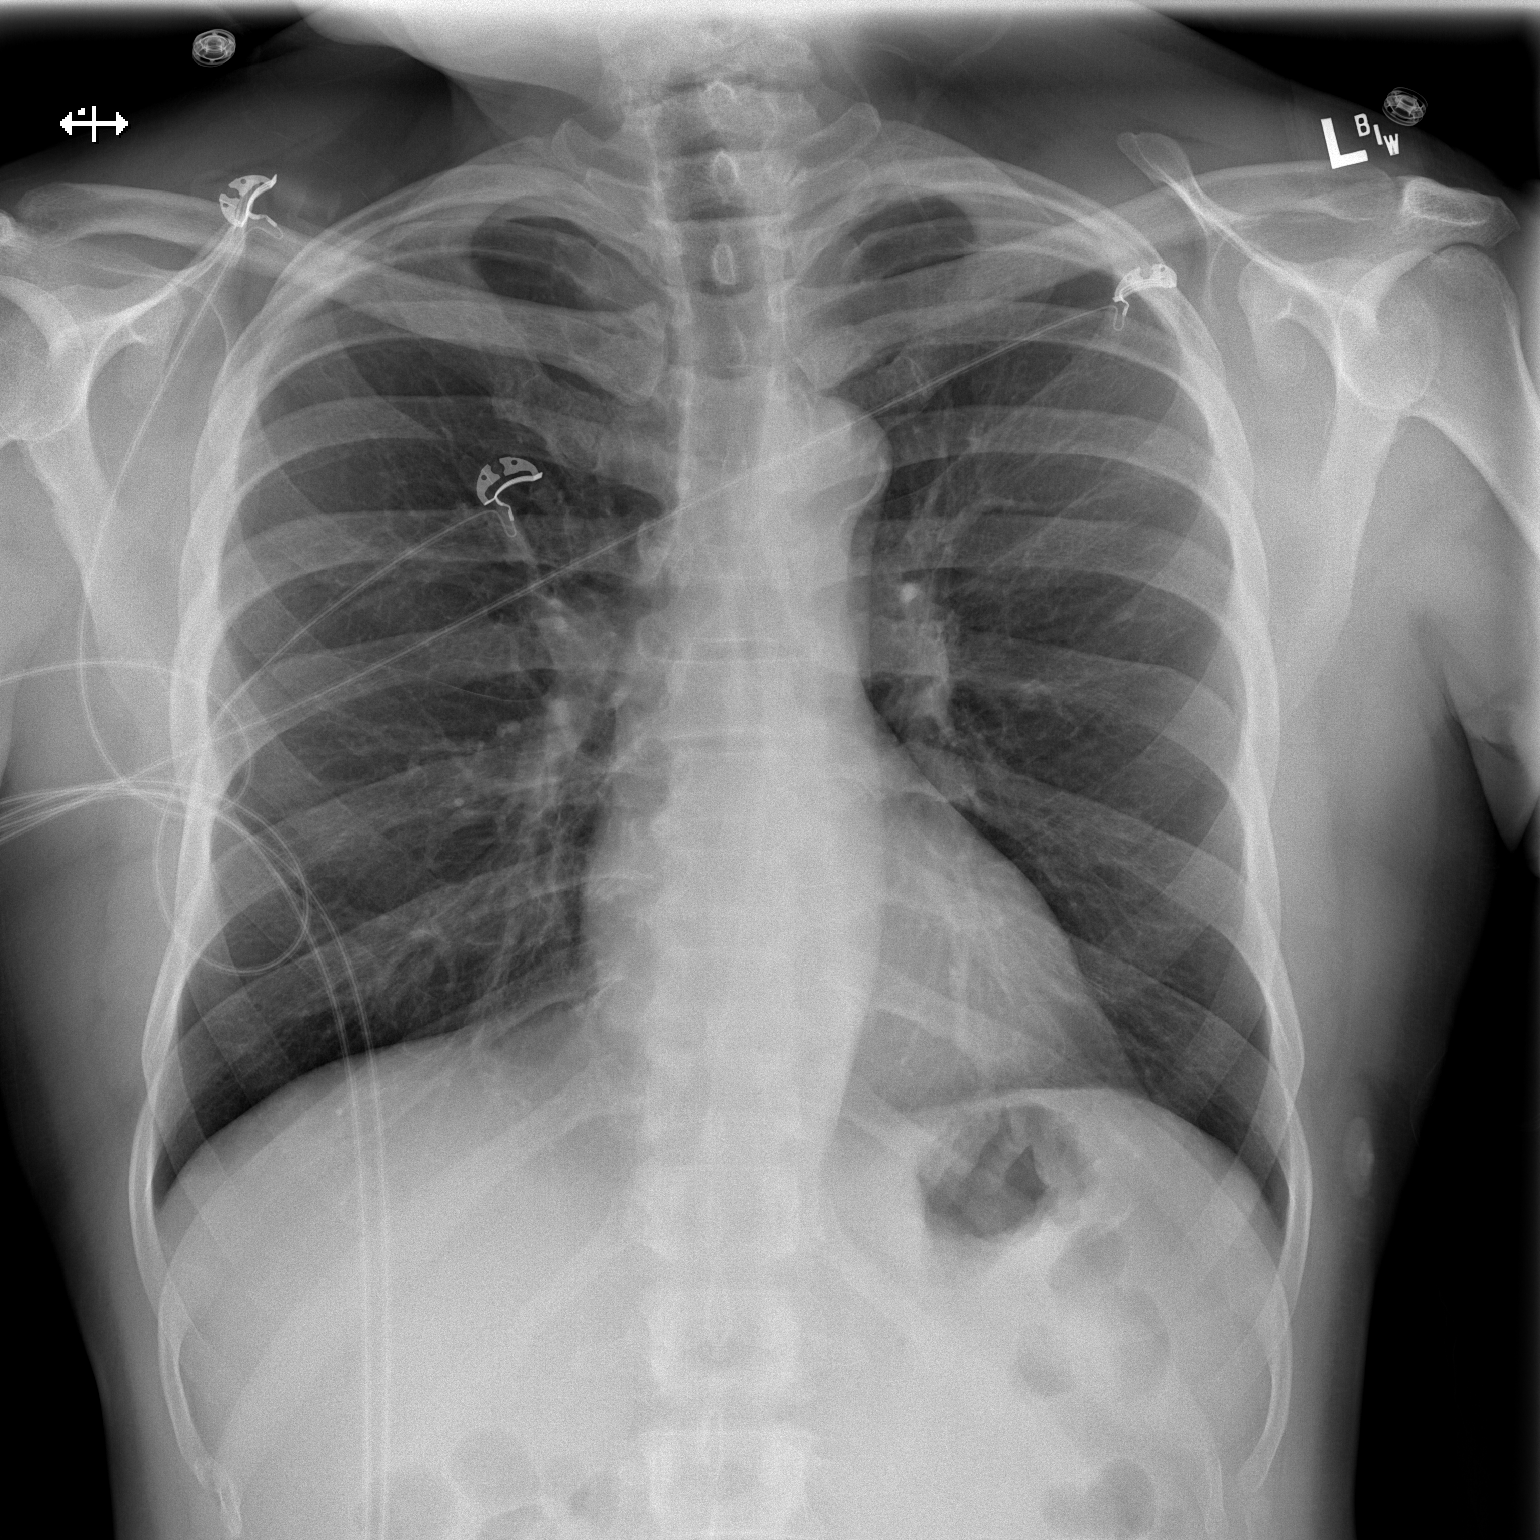

[w chest lat]
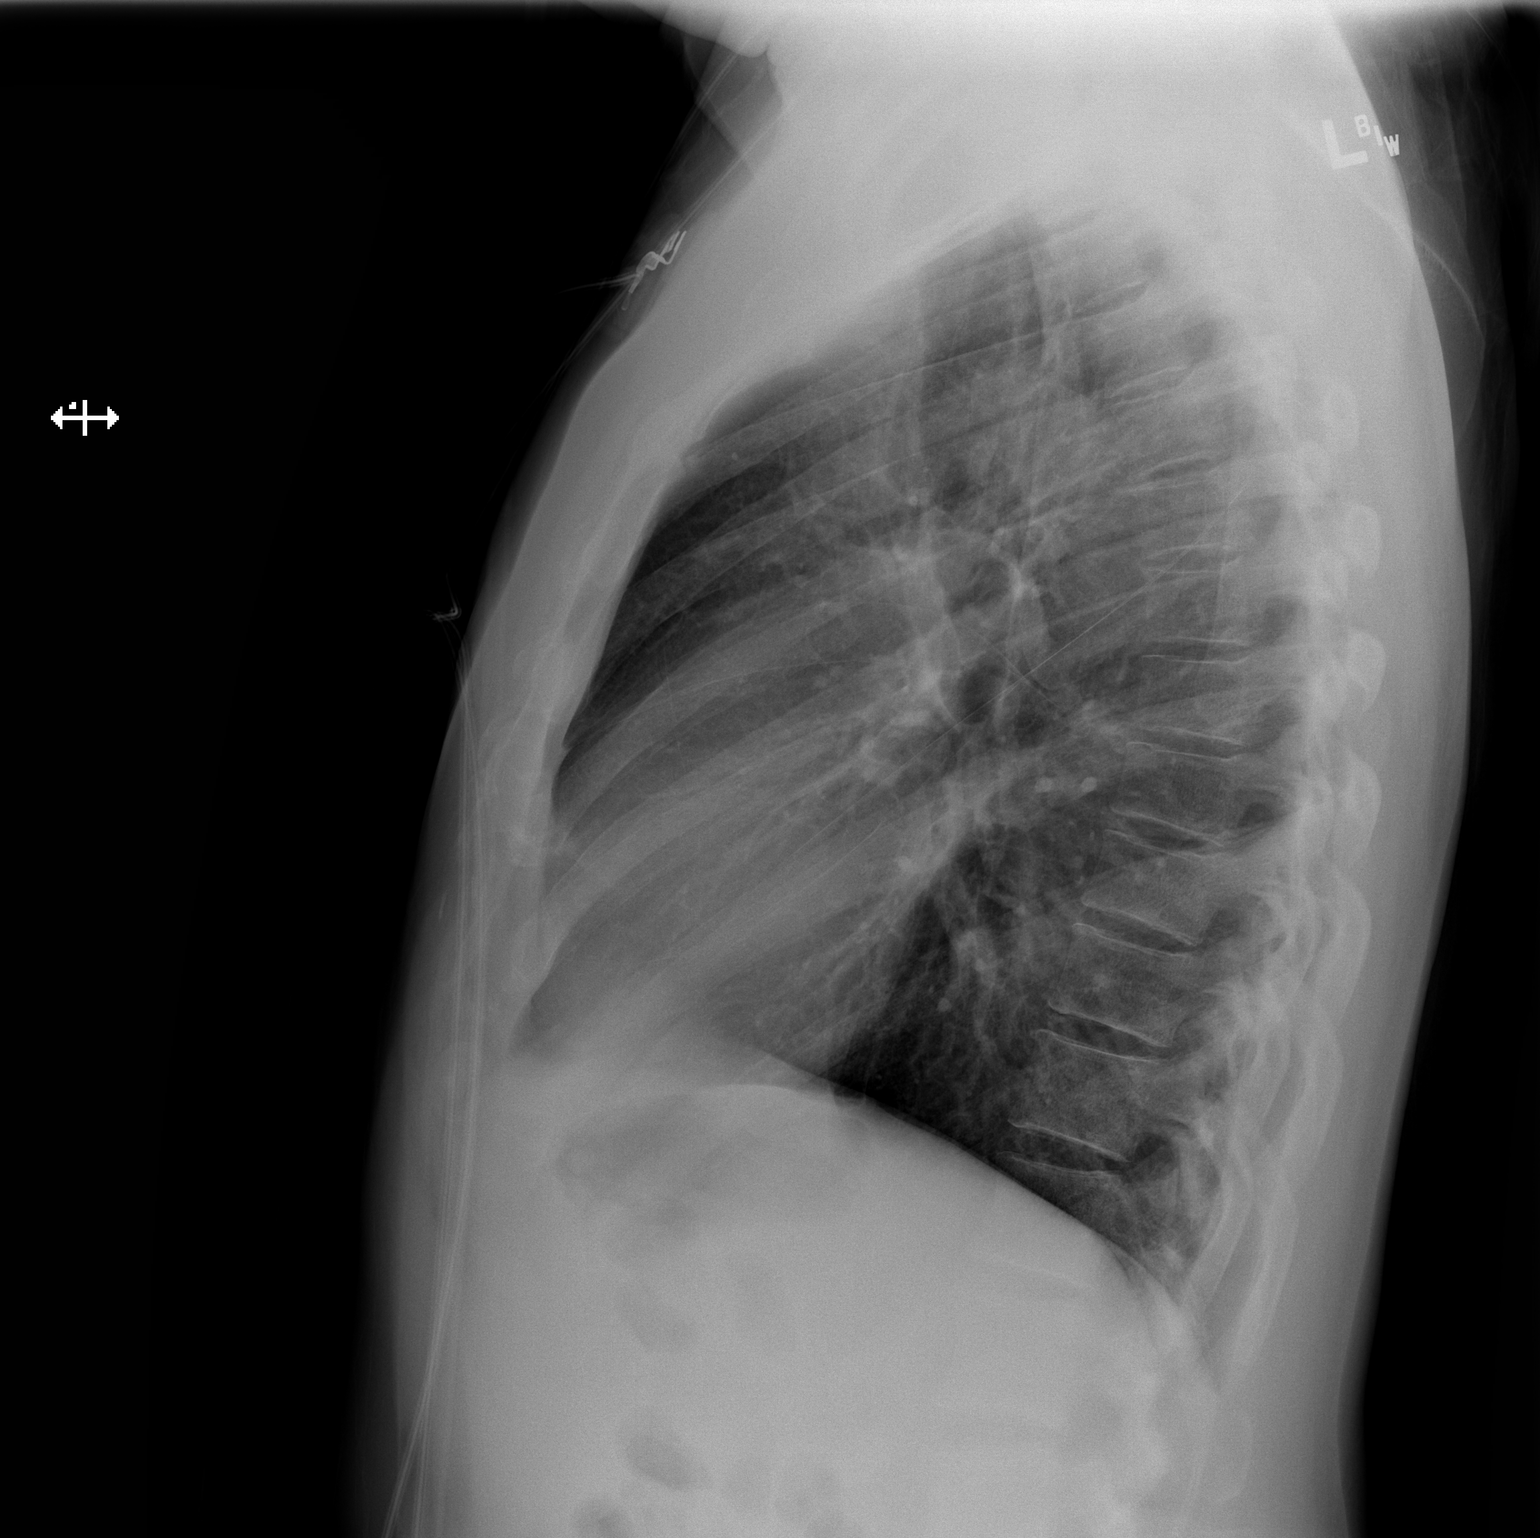

[2 of 2 positions shown; findings below may reference images not displayed]

FINDINGS: The cardiomediastinal silhouette is normal in size. Normal pulmonary
vascularity. Atherosclerotic calcification of the aortic arch. No
focal consolidation, pleural effusion, or pneumothorax. No acute
osseous abnormality.
IMPRESSION: 1. No active cardiopulmonary disease.
2.  Aortic Atherosclerosis ([id number]).

## 2020-01-29 ENCOUNTER — Other Ambulatory Visit: Payer: Self-pay | Admitting: Adult Health

## 2020-05-07 ENCOUNTER — Other Ambulatory Visit (HOSPITAL_COMMUNITY): Payer: Self-pay | Admitting: Internal Medicine

## 2020-05-07 NOTE — Telephone Encounter (Signed)
Dr. Swaziland, Dr. Gala Romney in Heart failure prescribed this medication. Do you want pt to continue. Please advise. Thank  you

## 2020-05-11 ENCOUNTER — Telehealth: Payer: Self-pay

## 2020-05-11 NOTE — Telephone Encounter (Signed)
Called patient left message on personal voice mail I received a message Brilinta needed refilling.Advised to call back to schedule follow up appointment with Dr.Jordan.Last office visit 06/04/2018.

## 2020-10-27 ENCOUNTER — Other Ambulatory Visit: Payer: Self-pay

## 2020-10-27 MED ORDER — NITROGLYCERIN 0.4 MG SL SUBL: SUBLINGUAL_TABLET | SUBLINGUAL | 6 refills | Status: AC

## 2020-10-27 NOTE — Telephone Encounter (Signed)
Called patient left message on personal voice mail NTG refill sent to pharmacy.Advised to call office to schedule appointment with Dr.Jordan.

## 2021-04-14 ENCOUNTER — Other Ambulatory Visit (HOSPITAL_COMMUNITY): Payer: Self-pay | Admitting: Cardiology

## 2024-07-02 ENCOUNTER — Telehealth (HOSPITAL_BASED_OUTPATIENT_CLINIC_OR_DEPARTMENT_OTHER): Payer: Self-pay

## 2024-07-02 NOTE — Telephone Encounter (Signed)
   Pre-operative Risk Assessment    Patient Name: Peter Lynn  DOB: 30-Nov-1950 MRN: [id number]   Date of last office visit: 06/04/2018 with [facility]  Date of next office visit: NA   Request for Surgical Clearance    Procedure:  Dental Extraction - Amount of Teeth to be Pulled:  11- surgical   Date of Surgery:  Clearance TBD                                 Surgeon:  Dr. Sherrod Socks Group or Practice Name:  [facility] Phone number:  [phone] Fax number:  [phone]   Type of Clearance Requested:   - Medical  - Pharmacy:  Hold Aspirin  and Ticagrelor  (Brilinta ) not indicated   Type of Anesthesia:  Local    Additional requests/questions:    [name]   07/02/2024, 11:43 AM

## 2024-07-02 NOTE — Telephone Encounter (Signed)
 Patient has been scheduled for IN OFFICE VISIT

## 2024-07-02 NOTE — Telephone Encounter (Signed)
   Name: Peter Lynn  DOB: 04-10-1951  MRN: [id number]  Primary Cardiologist: Peter Jordan, MD  Chart reviewed as part of pre-operative protocol coverage. Because of Peter Lynn's past medical history and time since last visit, he will require a new patient in-office visit in order to better assess preoperative cardiovascular risk. He is not appropriate for [facility] due to his prior cardiac history.   Pre-op  covering staff: - Please schedule appointment and call patient to inform them. If patient already had an upcoming appointment within acceptable timeframe, please add pre-op  clearance to the appointment notes so provider is aware. - Please contact requesting surgeon's office via preferred method (i.e, phone, fax) to inform them of need for appointment prior to surgery.  This message will also be routed to Dr. Jordan for input on holding Brillinta as requested below so that this information is available to the clearing provider at time of patient's appointment.   Peter Lynn E Peter Sakata, NP  07/02/2024, 1:41 PM

## 2024-07-08 NOTE — Progress Notes (Signed)
 Cardiology Office Note:   Date:  07/08/2024  ID:  Peter Lynn, DOB 26-Apr-1951, MRN [id number] PCP: Patient, No Pcp Per  [facility] Providers Cardiologist:  Peter Jordan, MD { Chief Complaint: No chief complaint on file.     History of Present Illness:   Peter Lynn is a 73 y.o. male with a PMH of CAD c/b NSTEMI s/p PCI to RCA (2018), HLD, COPD who presents to reestablish care for preoperative risk evaluation.  The patient was hospitalized at [facility] in 2018 after suffering an NSTEMI.  LHC at that time demonstrated single-vessel RCA occlusion with nonobstructive CAD elsewhere.  He underwent PCI and was placed on aspirin  and Brilinta  for DAPT.  The last time the patient followed up with cardiology was in 2018, but has not been seen by cardiology since.  The patient is scheduled to have several teeth pulled and hence presents for preoperative cardiovascular assessment.     Past Medical History:  Diagnosis Date   CAD (coronary artery disease)    a. cath 03/27/17 - ingle vessel occlusive CAD with thrombotic mid RCA occlusion s/p PTCA and DES. recomended femoral approach    COPD (chronic obstructive pulmonary disease) (HCC)    Glaucoma    Hyperlipidemia LDL goal <70    NSTEMI (non-ST elevated myocardial infarction) [facility])      Studies Reviewed:    EKG: ***       Cardiac Studies & Procedures   ______________________________________________________________________________________________ CARDIAC CATHETERIZATION  CARDIAC CATHETERIZATION 03/27/2017  Conclusion  Mid LAD to Dist LAD lesion, 30 %stenosed.  2nd Diag lesion, 30 %stenosed.  2nd RPLB lesion, 100 %stenosed.  There is mild left ventricular systolic dysfunction.  LV end diastolic pressure is mildly elevated.  The left ventricular ejection fraction is 50-55% by visual estimate.  A STENT RESOLUTE ONYX 4.0X30 drug eluting stent was successfully placed.  Mid RCA lesion, 100 %stenosed.  Post  intervention, there is a 0% residual stenosis.  1. 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 The RCA was a very large dominant vessel. Although there were some collaterals the RCA was incompletely filled. 2. Mild LV dysfunction with inferior hypokinesis. 3. Mildly elevated LVEDP 4. Successful stenting of the mid RCA with DES.  Plan: the patient's anatomy was very challenging from the right radial approach. The RCA arises anteriorly and has a downward takeoff. The LCA arises high in the cusp and was very difficult to engage. If Cardiac cath needed in the future I would consider a femoral approach. While there were some collaterals to the RCA I was concerned that these were inadequate and the RCA supplied a very large territory. There was also clearly viability of the inferior wall. Recommend DAPT for one year. Anticipate DC in am if stable.  Findings Coronary Findings Diagnostic  Dominance: Right  Left Main Vessel was injected. Vessel is normal in caliber. Vessel is angiographically normal.  Left Anterior Descending  Second Diagonal Branch  Left Circumflex Vessel is small.  Right Coronary Artery Vessel is large. Collaterals Dist RCA filled by collaterals from Acute Mrg.  The lesion is thrombotic with left-to-right and right-to-right collateral flow.  Right Posterior Descending Artery Collaterals RPDA filled by collaterals from Dist LAD.  Second Right Posterolateral Branch  Intervention  Mid RCA lesion Angioplasty Lesion crossed with guidewire using a WIRE PT2 MS 185. Pre-stent angioplasty was performed using a BALLOON EMERGE MR 2.5X12. Maximum pressure: 8 atm. A STENT RESOLUTE ONYX 4.0X30 drug eluting stent was successfully placed. Stent  strut is well apposed. Post-stent angioplasty was performed using a BALLOON Coleta EUPHORA RX4.0X20. Maximum pressure: 16 atm. There is no pre-interventional antegrade distal flow (TIMI 0).  The post-interventional distal flow  is normal (TIMI 3). The intervention was successful . No complications occurred at this lesion. There is a 0% residual stenosis post intervention.     ECHOCARDIOGRAM  ECHOCARDIOGRAM COMPLETE 04/04/2017  Narrative *[name]* [address] [place], [place] [address] [phone]  ------------------------------------------------------------------- Transthoracic Echocardiography  Patient:    [name], [name] MR #:       [id number] Study Date: 04/04/2017 Gender:     M Age:        31 Height:     175.3 cm Weight:     87 kg BSA:        2.08 m^2 Pt. Status: Room:  ATTENDING    Aleene Passe, M.D. SONOGRAPHER  Jon Hacker PERFORMING   [facility], Outpatient ORDERING     Bhagat, Bhavinkumar REFERRING    Bhagat, [place]  cc:  ------------------------------------------------------------------- LV EF: 60% -   65%  ------------------------------------------------------------------- Indications:      I25.10 Coronary artery disease of the native artery.  ------------------------------------------------------------------- History:   PMH:   Chest pain.  Chronic obstructive pulmonary disease.  ------------------------------------------------------------------- Study Conclusions  - Left ventricle: The cavity size was normal. Wall thickness was increased in a pattern of mild LVH. Systolic function was normal. The estimated ejection fraction was in the range of 60% to 65%. Wall motion was normal; there were no regional wall motion abnormalities. Left ventricular diastolic function parameters were normal for the patient&'s age. - Mitral valve: There was mild regurgitation. - Right atrium: The atrium was mildly dilated.  ------------------------------------------------------------------- Study data:  No prior study was available for comparison.  Study status:  Routine.  Procedure:  The patient reported no pain pre or post test. Transthoracic echocardiography. Image  quality was adequate.  Study completion:  There were no complications. Transthoracic echocardiography.  M-mode, complete 2D, spectral Doppler, and color Doppler.  Birthdate:  Patient birthdate: [birth year].  Age:  Patient is 73 yr old.  Sex:  Gender: male. BMI: 28.3 kg/m^2.  Blood pressure:     118/74  Patient status: Outpatient.  Study date:  Study date: 04/04/2017. Study time: 03:13 PM.  Location:   [facility]  -------------------------------------------------------------------  ------------------------------------------------------------------- Left ventricle:  The cavity size was normal. Wall thickness was increased in a pattern of mild LVH. Systolic function was normal. The estimated ejection fraction was in the range of 60% to 65%. Wall motion was normal; there were no regional wall motion abnormalities. Left ventricular diastolic function parameters were normal for the patient&'s age.  ------------------------------------------------------------------- Aortic valve:   Structurally normal valve.   Cusp separation was normal.  Doppler:  Transvalvular velocity was within the normal range. There was no stenosis. There was no regurgitation.  ------------------------------------------------------------------- Aorta:  Aortic root: The aortic root was normal in size. Ascending aorta: The ascending aorta was normal in size.  ------------------------------------------------------------------- Mitral valve:   Structurally normal valve.   Leaflet separation was normal.  Doppler:  Transvalvular velocity was within the normal range. There was no evidence for stenosis. There was mild regurgitation.    Peak gradient (D): 3 mm Hg.  ------------------------------------------------------------------- Left atrium:  The atrium was normal in size.  ------------------------------------------------------------------- Right ventricle:  The cavity size was normal. Systolic function  was normal.  ------------------------------------------------------------------- Pulmonic valve:    The valve appears to be grossly normal. Doppler:  There was trivial regurgitation.  ------------------------------------------------------------------- Tricuspid valve:   The valve appears to be grossly normal. Doppler:  There was mild regurgitation.  ------------------------------------------------------------------- Pulmonary artery:   Systolic pressure was within the normal range.  ------------------------------------------------------------------- Right atrium:  The atrium was mildly dilated.  ------------------------------------------------------------------- Pericardium:  There was no pericardial effusion.  ------------------------------------------------------------------- Measurements  Left ventricle                         Value        Reference LV ID, ED, PLAX chordal        (L)     41.3  mm     43 - 52 LV ID, ES, PLAX chordal                29.8  mm     23 - 38 LV fx shortening, PLAX chordal (L)     28    %      >=29 LV PW thickness, ED                    12.2  mm     --------- IVS/LV PW ratio, ED                    1.03         <=1.3 LV e&', lateral                         10.7  cm/s   --------- LV E/e&', lateral                       7.43         --------- LV e&', medial                          9.43  cm/s   --------- LV E/e&', medial                        8.43         --------- LV e&', average                         10.07 cm/s   --------- LV E/e&', average                       7.9          ---------  Ventricular septum                     Value        Reference IVS thickness, ED                      12.6  mm     ---------  LVOT                                   Value        Reference LVOT ID, S                             22    mm     --------- LVOT area  3.8   cm^2   ---------  Aorta                                  Value         Reference Aortic root ID, ED                     40    mm     ---------  Left atrium                            Value        Reference LA ID, A-P, ES                         32    mm     --------- LA ID/bsa, A-P                         1.54  cm/m^2 <=2.2 LA volume, S                           46    ml     --------- LA volume/bsa, S                       22.1  ml/m^2 --------- LA volume, ES, 1-p A4C                 37    ml     --------- LA volume/bsa, ES, 1-p A4C             17.8  ml/m^2 --------- LA volume, ES, 1-p A2C                 49    ml     --------- LA volume/bsa, ES, 1-p A2C             23.6  ml/m^2 ---------  Mitral valve                           Value        Reference Mitral E-wave peak velocity            79.5  cm/s   --------- Mitral A-wave peak velocity            77    cm/s   --------- Mitral deceleration time               180   ms     150 - 230 Mitral peak gradient, D                3     mm Hg  --------- Mitral E/A ratio, peak                 1            ---------  Pulmonary arteries                     Value        Reference PA pressure, S, DP                     22    mm Hg  <=  30  Tricuspid valve                        Value        Reference Tricuspid regurg peak velocity         217   cm/s   --------- Tricuspid peak RV-RA gradient          19    mm Hg  ---------  Systemic veins                         Value        Reference Estimated CVP                          3     mm Hg  ---------  Right ventricle                        Value        Reference RV pressure, S, DP                     22    mm Hg  <=30 RV s&', lateral, S                      12    cm/s   ---------  Legend: (L)  and  (H)  mark values outside specified reference range.  ------------------------------------------------------------------- Prepared and Electronically Authenticated by  Aleene Passe, M.D. 2018-08-15T17:43:21           ______________________________________________________________________________________________      Risk Assessment/Calculations:   {Does this patient have ATRIAL FIBRILLATION?:[id number]} No BP recorded.  {Refresh Note OR Click here to enter BP  :1}***        Physical Exam:     VS:  There were no vitals taken for this visit. ***    Wt Readings from Last 3 Encounters:  06/04/18 179 lb 6.4 oz (81.4 kg)  12/06/17 179 lb 12.8 oz (81.6 kg)  09/03/17 180 lb 5.4 oz (81.8 kg)     GEN: Well nourished, well developed, in no acute distress NECK: No JVD; No carotid bruits CARDIAC: ***RRR, no murmurs, rubs, gallops RESPIRATORY:  Clear to auscultation without rales, wheezing or rhonchi  ABDOMEN: Soft, non-tender, non-distended, normal bowel sounds EXTREMITIES:  Warm and well perfused, no edema; No deformity, 2+ radial pulses PSYCH: Normal mood and affect   Assessment & Plan Preoperative cardiovascular examination  NSTEMI (non-ST elevated myocardial infarction) (HCC) Discontinue Brilinta  Discontinue metoprolol  succinate Hyperlipidemia LDL goal <70 Lipid panel      {Are you ordering a CV Procedure (e.g. stress test, cath, DCCV, TEE, etc)?   Press F2        :[id number]}   This note was written with the assistance of a dictation microphone or AI dictation software. Please excuse any typos or grammatical errors.   Signed, Georganna Archer, MD 07/08/2024 8:47 PM    [facility]

## 2024-07-08 NOTE — Assessment & Plan Note (Signed)
 Discontinue Brilinta  Discontinue metoprolol  succinate

## 2024-07-08 NOTE — Assessment & Plan Note (Signed)
 Lipid panel

## 2024-07-09 ENCOUNTER — Encounter: Payer: Self-pay | Admitting: Student in an Organized Health Care Education/Training Program

## 2024-07-09 ENCOUNTER — Ambulatory Visit
Attending: Student in an Organized Health Care Education/Training Program | Admitting: Student in an Organized Health Care Education/Training Program

## 2024-07-09 ENCOUNTER — Other Ambulatory Visit (HOSPITAL_COMMUNITY): Payer: Self-pay

## 2024-07-09 VITALS — BP 175/72 | HR 57 | Ht 70.0 in | Wt 190.0 lb

## 2024-07-09 DIAGNOSIS — E785 Hyperlipidemia, unspecified: Secondary | ICD-10-CM | POA: Diagnosis not present

## 2024-07-09 DIAGNOSIS — I10 Essential (primary) hypertension: Secondary | ICD-10-CM | POA: Insufficient documentation

## 2024-07-09 DIAGNOSIS — I214 Non-ST elevation (NSTEMI) myocardial infarction: Secondary | ICD-10-CM

## 2024-07-09 DIAGNOSIS — Z01810 Encounter for preprocedural cardiovascular examination: Secondary | ICD-10-CM

## 2024-07-09 LAB — LIPID PANEL

## 2024-07-09 MED ORDER — ASPIRIN 81 MG PO TBEC
81.0000 mg | DELAYED_RELEASE_TABLET | Freq: Every day | ORAL | 3 refills | Status: DC
  Filled 2024-07-09: qty 90, 90d supply, fill #0

## 2024-07-09 MED ORDER — AMLODIPINE BESYLATE-VALSARTAN 10-160 MG PO TABS
1.0000 | ORAL_TABLET | Freq: Every day | ORAL | 3 refills | Status: DC
  Filled 2024-07-09: qty 30, 30d supply, fill #0

## 2024-07-09 MED ORDER — ATORVASTATIN CALCIUM 80 MG PO TABS
80.0000 mg | ORAL_TABLET | Freq: Every day | ORAL | 3 refills | Status: DC
  Filled 2024-07-09: qty 30, 30d supply, fill #0

## 2024-07-09 MED ORDER — OMRON 3 SERIES BP MONITOR DEVI
0 refills | Status: AC
  Filled 2024-07-09: qty 1, 30d supply, fill #0

## 2024-07-09 NOTE — Patient Instructions (Addendum)
 Medication Instructions:  START Aspirin  81 mg daily  START atorvastatin  80 mg daily  START Amlodipine- valsartan 10-160 mg daily   START use of blood pressure cuff twice daily for two weeks and please advise results to the office:  Blood Pressure Record Sheet To take your blood pressure, you will need a blood pressure machine. You can buy a blood pressure machine (blood pressure monitor) at your clinic, drug store, or online. When choosing one, consider: An automatic monitor that has an arm cuff. A cuff that wraps snugly around your upper arm. You should be able to fit only one finger between your arm and the cuff. A device that stores blood pressure reading results. Do not choose a monitor that measures your blood pressure from your wrist or finger. Follow your health care provider's instructions for how to take your blood pressure. To use this form: Take your blood pressure medications every day These measurements should be taken when you have been at rest for at least 10-15 min Take at least 2 readings with each blood pressure check. This makes sure the results are correct. Wait 1-2 minutes between measurements. Write down the results in the spaces on this form. Keep in mind it should always be recorded systolic over diastolic. Both numbers are important.  Repeat this every day for 2-3 weeks, or as told by your health care provider.  Make a follow-up appointment with your health care provider to discuss the results.  Blood Pressure Log Date Medications taken? (Y/N) Blood Pressure Time of Day                                                                                                         *If you need a refill on your cardiac medications before your next appointment, please call your pharmacy*  Lab Work: BMP LIPID PANEL  A1C   HEPATIC PANEL - 3 MONTHS  LIPID PANEL - 3 MONTHS  If you have labs (blood work) drawn today and your tests are completely  normal, you will receive your results only by: MyChart Message (if you have MyChart) OR A paper copy in the mail If you have any lab test that is abnormal or we need to change your treatment, we will call you to review the results.  Follow-Up: At [facility], you and your health needs are our priority.  As part of our continuing mission to provide you with exceptional heart care, our providers are all part of one team.  This team includes your primary Cardiologist (physician) and Advanced Practice Providers or APPs (Physician Assistants and Nurse Practitioners) who all work together to provide you with the care you need, when you need it.  Your next appointment:   1 month(s)  Provider:   Georganna Archer, MD    We recommend signing up for the patient portal called MyChart.  Sign up information is provided on this After Visit Summary.  MyChart is used to connect with patients for Virtual Visits (Telemedicine).  Patients are able to view lab/test results, encounter notes, upcoming appointments,  etc.  Non-urgent messages can be sent to your provider as well.   To learn more about what you can do with MyChart, go to [email or web address].

## 2024-07-09 NOTE — Addendum Note (Signed)
 Addended by: Kanitra Purifoy T on: 07/09/2024 10:37 AM   Modules accepted: Orders

## 2024-07-09 NOTE — Assessment & Plan Note (Signed)
-   His blood pressure is markedly elevated today on the initial screening on repeat. - He will need at least 2 blood pressure agents to get him to his goal of <130/80. - I will start a combination pill of amlodipine and valsartan today. Start amlodipine 10 -valsartan 160 today Prescribed blood pressure cuff for home monitoring.  I counseled the patient on how to properly take his blood pressure. The patient will do a 2-week blood pressure log and submit to me. Follow-up in 1 month BMP today

## 2024-07-10 LAB — LIPID PANEL
Cholesterol, Total: 206 mg/dL — AB (ref 100–199)
HDL: 46 mg/dL (ref >39)
LDL CALC COMMENT:: 4.5 ratio (ref 0.0–5.0)
LDL Chol Calc (NIH): 126 mg/dL — AB (ref 0–99)
Triglycerides: 189 mg/dL — AB (ref 0–149)
VLDL Cholesterol Cal: 34 mg/dL (ref 5–40)

## 2024-07-10 LAB — BASIC METABOLIC PANEL WITH GFR
BUN/Creatinine Ratio: 15 (ref 10–24)
BUN: 15 mg/dL (ref 8–27)
CO2: 20 mmol/L (ref 20–29)
Calcium: 9.5 mg/dL (ref 8.6–10.2)
Chloride: 104 mmol/L (ref 96–106)
Creatinine, Ser: 0.97 mg/dL (ref 0.76–1.27)
Glucose: 87 mg/dL (ref 70–99)
Potassium: 4.3 mmol/L (ref 3.5–5.2)
Sodium: 142 mmol/L (ref 134–144)
eGFR: 81 mL/min/1.73 (ref >59)

## 2024-07-10 LAB — HEMOGLOBIN A1C
Est. average glucose Bld gHb Est-mCnc: 126 mg/dL
Hgb A1c MFr Bld: 6 % — ABNORMAL HIGH (ref 4.8–5.6)

## 2024-07-11 ENCOUNTER — Other Ambulatory Visit: Payer: Self-pay

## 2024-07-11 ENCOUNTER — Ambulatory Visit: Payer: Self-pay | Admitting: Student in an Organized Health Care Education/Training Program

## 2024-07-11 DIAGNOSIS — E785 Hyperlipidemia, unspecified: Secondary | ICD-10-CM

## 2024-07-11 MED ORDER — ATORVASTATIN CALCIUM 80 MG PO TABS
80.0000 mg | ORAL_TABLET | Freq: Every day | ORAL | 3 refills | Status: AC
Start: 2024-07-11 — End: ?

## 2024-07-11 MED ORDER — AMLODIPINE BESYLATE-VALSARTAN 10-160 MG PO TABS: 1.0000 | ORAL_TABLET | Freq: Every day | ORAL | 3 refills | Status: DC

## 2024-07-11 MED ORDER — ASPIRIN 81 MG PO TBEC
81.0000 mg | DELAYED_RELEASE_TABLET | Freq: Every day | ORAL | 3 refills | Status: AC
Start: 2024-07-11 — End: ?

## 2024-07-11 NOTE — Progress Notes (Signed)
 The patient has been notified of the result and verbalized understanding. All questions (if any) were answered.    Ordered a lipid and has been release.

## 2024-08-19 NOTE — Assessment & Plan Note (Deleted)
 Lipid panel

## 2024-08-19 NOTE — Progress Notes (Incomplete)
 " Cardiology Office Note:   Date:  08/19/2024  ID:  Peter Lynn, DOB [birth year], MRN [id number] PCP: Patient, No Pcp Per  [facility] Providers Cardiologist:  Georganna Archer, MD { Chief Complaint:  No chief complaint on file.     History of Present Illness:   Peter Lynn is a 73 y.o. male with a PMH of CAD c/b NSTEMI s/p PCI to RCA (2018), HLD, COPD who presents to reestablish care for follow-up.  Interval History 08/19/2024 :      Past Medical History:  Diagnosis Date   CAD (coronary artery disease)    a. cath 03/27/17 - ingle vessel occlusive CAD with thrombotic mid RCA occlusion s/p PTCA and DES. recomended femoral approach    COPD (chronic obstructive pulmonary disease) (HCC)    Glaucoma    Hyperlipidemia LDL goal <70    NSTEMI (non-ST elevated myocardial infarction) [facility])      Studies Reviewed:    EKG:        Cardiac Studies & Procedures   ______________________________________________________________________________________________ CARDIAC CATHETERIZATION  CARDIAC CATHETERIZATION 03/27/2017  Conclusion  Mid LAD to Dist LAD lesion, 30 %stenosed.  2nd Diag lesion, 30 %stenosed.  2nd RPLB lesion, 100 %stenosed.  There is mild left ventricular systolic dysfunction.  LV end diastolic pressure is mildly elevated.  The left ventricular ejection fraction is 50-55% by visual estimate.  A STENT RESOLUTE ONYX 4.0X30 drug eluting stent was successfully placed.  Mid RCA lesion, 100 %stenosed.  Post intervention, there is a 0% residual stenosis.  1. 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 The RCA was a very large dominant vessel. Although there were some collaterals the RCA was incompletely filled. 2. Mild LV dysfunction with inferior hypokinesis. 3. Mildly elevated LVEDP 4. Successful stenting of the mid RCA with DES.  Plan: the patient's anatomy was very challenging from the right radial approach. The RCA arises  anteriorly and has a downward takeoff. The LCA arises high in the cusp and was very difficult to engage. If Cardiac cath needed in the future I would consider a femoral approach. While there were some collaterals to the RCA I was concerned that these were inadequate and the RCA supplied a very large territory. There was also clearly viability of the inferior wall. Recommend DAPT for one year. Anticipate DC in am if stable.  Findings Coronary Findings Diagnostic  Dominance: Right  Left Main Vessel was injected. Vessel is normal in caliber. Vessel is angiographically normal.  Left Anterior Descending  Second Diagonal Branch  Left Circumflex Vessel is small.  Right Coronary Artery Vessel is large. Collaterals Dist RCA filled by collaterals from Acute Mrg.  The lesion is thrombotic with left-to-right and right-to-right collateral flow.  Right Posterior Descending Artery Collaterals RPDA filled by collaterals from Dist LAD.  Second Right Posterolateral Branch  Intervention  Mid RCA lesion Angioplasty Lesion crossed with guidewire using a WIRE PT2 MS 185. Pre-stent angioplasty was performed using a BALLOON EMER[place] MR 2.5X12. Maximum pressure: 8 atm. A STENT RESOLUTE ONYX 4.0X30 drug eluting stent was successfully placed. Stent strut is well apposed. Post-stent angioplasty was performed using a BALLOON [place] EUPHORA RX4.0X20. Maximum pressure: 16 atm. There is no pre-interventional antegrade distal flow (TIMI 0).  The post-interventional distal flow is normal (TIMI 3). The intervention was successful . No complications occurred at this lesion. There is a 0% residual stenosis post intervention.     ECHOCARDIOGRAM  ECHOCARDIOGRAM COMPLETE 04/04/2017  Narrative *[name]* [address] [place], [place] [address] [phone]  ------------------------------------------------------------------- Transthoracic Echocardiography  Patient:    Peter, Lynn MR #:        [id number] Study Date: 04/04/2017 Gender:     M Age:        60 Height:     175.3 cm Weight:     87 kg BSA:        2.08 m^2 Pt. Status: Room:  ATTENDING    Aleene Passe, M.D. SONOGRAPHER  Jon Hacker PERFORMING   [facility], Outpatient ORDERING     Bhagat, Bhavinkumar REFERRING    Bhagat, [place]  cc:  ------------------------------------------------------------------- LV EF: 60% -   65%  ------------------------------------------------------------------- Indications:      I25.10 Coronary artery disease of the native artery.  ------------------------------------------------------------------- History:   PMH:   Chest pain.  Chronic obstructive pulmonary disease.  ------------------------------------------------------------------- Study Conclusions  - Left ventricle: The cavity size was normal. Wall thickness was increased in a pattern of mild LVH. Systolic function was normal. The estimated ejection fraction was in the range of 60% to 65%. Wall motion was normal; there were no regional wall motion abnormalities. Left ventricular diastolic function parameters were normal for the patient&'s age. - Mitral valve: There was mild regurgitation. - Right atrium: The atrium was mildly dilated.  ------------------------------------------------------------------- Study data:  No prior study was available for comparison.  Study status:  Routine.  Procedure:  The patient reported no pain pre or post test. Transthoracic echocardiography. Image quality was adequate.  Study completion:  There were no complications. Transthoracic echocardiography.  M-mode, complete 2D, spectral Doppler, and color Doppler.  Birthdate:  Patient birthdate: 1950-10-01.  Age:  Patient is 73 yr old.  Sex:  Gender: male. BMI: 28.3 kg/m^2.  Blood pressure:     118/74  Patient status: Outpatient.  Study date:  Study date: 04/04/2017. Study time: 03:13 PM.  Location:  [facility]  -------------------------------------------------------------------  ------------------------------------------------------------------- Left ventricle:  The cavity size was normal. Wall thickness was increased in a pattern of mild LVH. Systolic function was normal. The estimated ejection fraction was in the range of 60% to 65%. Wall motion was normal; there were no regional wall motion abnormalities. Left ventricular diastolic function parameters were normal for the patient&'s age.  ------------------------------------------------------------------- Aortic valve:   Structurally normal valve.   Cusp separation was normal.  Doppler:  Transvalvular velocity was within the normal range. There was no stenosis. There was no regurgitation.  ------------------------------------------------------------------- Aorta:  Aortic root: The aortic root was normal in size. Ascending aorta: The ascending aorta was normal in size.  ------------------------------------------------------------------- Mitral valve:   Structurally normal valve.   Leaflet separation was normal.  Doppler:  Transvalvular velocity was within the normal range. There was no evidence for stenosis. There was mild regurgitation.    Peak gradient (D): 3 mm Hg.  ------------------------------------------------------------------- Left atrium:  The atrium was normal in size.  ------------------------------------------------------------------- Right ventricle:  The cavity size was normal. Systolic function was normal.  ------------------------------------------------------------------- Pulmonic valve:    The valve appears to be grossly normal. Doppler:  There was trivial regurgitation.  ------------------------------------------------------------------- Tricuspid valve:   The valve appears to be grossly normal. Doppler:  There was mild  regurgitation.  ------------------------------------------------------------------- Pulmonary artery:   Systolic pressure was within the normal range.  ------------------------------------------------------------------- Right atrium:  The atrium was mildly dilated.  ------------------------------------------------------------------- Pericardium:  There was no pericardial effusion.  ------------------------------------------------------------------- Measurements  Left ventricle  Value        Reference LV ID, ED, PLAX chordal        (L)     41.3  mm     43 - 52 LV ID, ES, PLAX chordal                29.8  mm     23 - 38 LV fx shortening, PLAX chordal (L)     28    %      >=29 LV PW thickness, ED                    12.2  mm     --------- IVS/LV PW ratio, ED                    1.03         <=1.3 LV e&', lateral                         10.7  cm/s   --------- LV E/e&', lateral                       7.43         --------- LV e&', medial                          9.43  cm/s   --------- LV E/e&', medial                        8.43         --------- LV e&', average                         10.07 cm/s   --------- LV E/e&', average                       7.9          ---------  Ventricular septum                     Value        Reference IVS thickness, ED                      12.6  mm     ---------  LVOT                                   Value        Reference LVOT ID, S                             22    mm     --------- LVOT area                              3.8   cm^2   ---------  Aorta                                  Value        Reference Aortic root ID, ED  40    mm     ---------  Left atrium                            Value        Reference LA ID, A-P, ES                         32    mm     --------- LA ID/bsa, A-P                         1.54  cm/m^2 <=2.2 LA volume, S                           46    ml     --------- LA volume/bsa, S                        22.1  ml/m^2 --------- LA volume, ES, 1-p A4C                 37    ml     --------- LA volume/bsa, ES, 1-p A4C             17.8  ml/m^2 --------- LA volume, ES, 1-p A2C                 49    ml     --------- LA volume/bsa, ES, 1-p A2C             23.6  ml/m^2 ---------  Mitral valve                           Value        Reference Mitral E-wave peak velocity            79.5  cm/s   --------- Mitral A-wave peak velocity            77    cm/s   --------- Mitral deceleration time               180   ms     150 - 230 Mitral peak gradient, D                3     mm Hg  --------- Mitral E/A ratio, peak                 1            ---------  Pulmonary arteries                     Value        Reference PA pressure, S, DP                     22    mm Hg  <=30  Tricuspid valve                        Value        Reference Tricuspid regurg peak velocity         217   cm/s   --------- Tricuspid peak RV-RA gradient          19    mm Hg  ---------  Systemic veins                         Value        Reference Estimated CVP                          3     mm Hg  ---------  Right ventricle                        Value        Reference RV pressure, S, DP                     22    mm Hg  <=30 RV s&', lateral, S                      12    cm/s   ---------  Legend: (L)  and  (H)  mark values outside specified reference range.  ------------------------------------------------------------------- Prepared and Electronically Authenticated by  Aleene Passe, M.D. 2018-08-15T17:43:21          ______________________________________________________________________________________________      Risk Assessment/Calculations:     No BP recorded.  {Refresh Note OR Click here to enter BP  :1}***        Physical Exam:     VS:  There were no vitals taken for this visit.     Wt Readings from Last 3 Encounters:  07/09/24 190 lb (86.2 kg)  06/04/18 179 lb 6.4 oz (81.4 kg)   12/06/17 179 lb 12.8 oz (81.6 kg)     GEN: Well nourished, well developed, in no acute distress NECK: No JVD; No carotid bruits CARDIAC: RRR, no murmurs, rubs, gallops RESPIRATORY:  Clear to auscultation without rales, wheezing or rhonchi  ABDOMEN: Soft, non-tender, non-distended, normal bowel sounds EXTREMITIES:  Warm and well perfused, no edema; No deformity, 2+ radial pulses PSYCH: Normal mood and affect   Assessment & Plan Primary hypertension  NSTEMI (non-ST elevated myocardial infarction) (HCC)  Hyperlipidemia LDL goal <70 Lipid panel          This note was written with the assistance of a dictation microphone or AI dictation software. Please excuse any typos or grammatical errors.   Signed, Georganna Archer, MD 08/19/2024 10:20 PM    [facility]  "

## 2024-08-20 ENCOUNTER — Ambulatory Visit: Payer: PRIVATE HEALTH INSURANCE | Admitting: Student in an Organized Health Care Education/Training Program

## 2024-08-20 DIAGNOSIS — I10 Essential (primary) hypertension: Secondary | ICD-10-CM

## 2024-08-20 DIAGNOSIS — I214 Non-ST elevation (NSTEMI) myocardial infarction: Secondary | ICD-10-CM

## 2024-08-20 DIAGNOSIS — E785 Hyperlipidemia, unspecified: Secondary | ICD-10-CM

## 2024-08-26 NOTE — Progress Notes (Signed)
 " Cardiology Office Note:   Date:  08/26/2024  ID:  Peter Lynn, DOB [birth year], MRN [id number] PCP: Patient, No Pcp Per  [facility] Providers Cardiologist:  Georganna Archer, MD { Chief Complaint:  No chief complaint on file.     History of Present Illness:   Peter Lynn is a 74 y.o. male with a PMH of CAD c/b NSTEMI s/p PCI to RCA (2018), HLD, COPD who presents for follow-up.  HPI: The patient was hospitalized at [facility] in 2018 after suffering an NSTEMI.  LHC at that time demonstrated single-vessel RCA occlusion with nonobstructive CAD elsewhere.  He underwent PCI and was placed on aspirin  and Brilinta  for DAPT.  The last time the patient followed up with cardiology was in 2018, but has not been seen by cardiology since.  The patient is scheduled to have several teeth pulled and hence presents for preoperative cardiovascular assessment.  The patient shares to me that since 2020 he has not been on any medications.  He states that he felt fine and did not realize that he had to be taking these medications long-term.  He denies chest pain, SOB, PND, orthopnea, swelling, syncope and presyncope.  He reports that he is not able to be very physically active due to orthopedic issues in his legs.  He is eager to get back on track though.  He denies tobacco, alcohol, illicit drug use.  No further complaints.  Interval History 08/27/24: -   Past Medical History:  Diagnosis Date   CAD (coronary artery disease)    a. cath 03/27/17 - ingle vessel occlusive CAD with thrombotic mid RCA occlusion s/p PTCA and DES. recomended femoral approach    COPD (chronic obstructive pulmonary disease) (HCC)    Glaucoma    Hyperlipidemia LDL goal <70    NSTEMI (non-ST elevated myocardial infarction) [facility])      Studies Reviewed:    EKG:        Cardiac Studies & Procedures   ______________________________________________________________________________________________ CARDIAC  CATHETERIZATION  CARDIAC CATHETERIZATION 03/27/2017  Conclusion  Mid LAD to Dist LAD lesion, 30 %stenosed.  2nd Diag lesion, 30 %stenosed.  2nd RPLB lesion, 100 %stenosed.  There is mild left ventricular systolic dysfunction.  LV end diastolic pressure is mildly elevated.  The left ventricular ejection fraction is 50-55% by visual estimate.  A STENT RESOLUTE ONYX 4.0X30 drug eluting stent was successfully placed.  Mid RCA lesion, 100 %stenosed.  Post intervention, there is a 0% residual stenosis.  1. 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 The RCA was a very large dominant vessel. Although there were some collaterals the RCA was incompletely filled. 2. Mild LV dysfunction with inferior hypokinesis. 3. Mildly elevated LVEDP 4. Successful stenting of the mid RCA with DES.  Plan: the patient's anatomy was very challenging from the right radial approach. The RCA arises anteriorly and has a downward takeoff. The LCA arises high in the cusp and was very difficult to engage. If Cardiac cath needed in the future I would consider a femoral approach. While there were some collaterals to the RCA I was concerned that these were inadequate and the RCA supplied a very large territory. There was also clearly viability of the inferior wall. Recommend DAPT for one year. Anticipate DC in am if stable.  Findings Coronary Findings Diagnostic  Dominance: Right  Left Main Vessel was injected. Vessel is normal in caliber. Vessel is angiographically normal.  Left Anterior Descending  Second Diagonal Branch  Left Circumflex  Vessel is small.  Right Coronary Artery Vessel is large. Collaterals Dist RCA filled by collaterals from Acute Mrg.  The lesion is thrombotic with left-to-right and right-to-right collateral flow.  Right Posterior Descending Artery Collaterals RPDA filled by collaterals from Dist LAD.  Second Right Posterolateral Branch  Intervention  Mid  RCA lesion Angioplasty Lesion crossed with guidewire using a WIRE PT2 MS 185. Pre-stent angioplasty was performed using a BALLOON [place] MR 2.5X12. Maximum pressure: 8 atm. A STENT RESOLUTE ONYX 4.0X30 drug eluting stent was successfully placed. Stent strut is well apposed. Post-stent angioplasty was performed using a BALLOON [place] EUPHORA RX4.0X20. Maximum pressure: 16 atm. There is no pre-interventional antegrade distal flow (TIMI 0).  The post-interventional distal flow is normal (TIMI 3). The intervention was successful . No complications occurred at this lesion. There is a 0% residual stenosis post intervention.     ECHOCARDIOGRAM  ECHOCARDIOGRAM COMPLETE 04/04/2017  Narrative *[name]* [address] [place], [place] [address] [phone]  ------------------------------------------------------------------- Transthoracic Echocardiography  Patient:    Peter Lynn, Peter Lynn MR #:       [id number] Study Date: 04/04/2017 Gender:     M Age:        50 Height:     175.3 cm Weight:     87 kg BSA:        2.08 m^2 Pt. Status: Room:  ATTENDING    Aleene Passe, M.D. SONOGRAPHER  Jon Hacker PERFORMING   [facility], Outpatient ORDERING     Bhagat, Bhavinkumar REFERRING    Bhagat, [place]  cc:  ------------------------------------------------------------------- LV EF: 60% -   65%  ------------------------------------------------------------------- Indications:      I25.10 Coronary artery disease of the native artery.  ------------------------------------------------------------------- History:   PMH:   Chest pain.  Chronic obstructive pulmonary disease.  ------------------------------------------------------------------- Study Conclusions  - Left ventricle: The cavity size was normal. Wall thickness was increased in a pattern of mild LVH. Systolic function was normal. The estimated ejection fraction was in the range of 60% to 65%. Wall motion was normal; there were  no regional wall motion abnormalities. Left ventricular diastolic function parameters were normal for the patient&'s age. - Mitral valve: There was mild regurgitation. - Right atrium: The atrium was mildly dilated.  ------------------------------------------------------------------- Study data:  No prior study was available for comparison.  Study status:  Routine.  Procedure:  The patient reported no pain pre or post test. Transthoracic echocardiography. Image quality was adequate.  Study completion:  There were no complications. Transthoracic echocardiography.  M-mode, complete 2D, spectral Doppler, and color Doppler.  Birthdate:  Patient birthdate: 1951-07-04.  Age:  Patient is 74 yr old.  Sex:  Gender: male. BMI: 28.3 kg/m^2.  Blood pressure:     118/74  Patient status: Outpatient.  Study date:  Study date: 04/04/2017. Study time: 03:13 PM.  Location:  [facility]  -------------------------------------------------------------------  ------------------------------------------------------------------- Left ventricle:  The cavity size was normal. Wall thickness was increased in a pattern of mild LVH. Systolic function was normal. The estimated ejection fraction was in the range of 60% to 65%. Wall motion was normal; there were no regional wall motion abnormalities. Left ventricular diastolic function parameters were normal for the patient&'s age.  ------------------------------------------------------------------- Aortic valve:   Structurally normal valve.   Cusp separation was normal.  Doppler:  Transvalvular velocity was within the normal range. There was no stenosis. There was no regurgitation.  ------------------------------------------------------------------- Aorta:  Aortic root: The aortic root was normal in size. Ascending aorta: The ascending  aorta was normal in size.  ------------------------------------------------------------------- Mitral valve:   Structurally  normal valve.   Leaflet separation was normal.  Doppler:  Transvalvular velocity was within the normal range. There was no evidence for stenosis. There was mild regurgitation.    Peak gradient (D): 3 mm Hg.  ------------------------------------------------------------------- Left atrium:  The atrium was normal in size.  ------------------------------------------------------------------- Right ventricle:  The cavity size was normal. Systolic function was normal.  ------------------------------------------------------------------- Pulmonic valve:    The valve appears to be grossly normal. Doppler:  There was trivial regurgitation.  ------------------------------------------------------------------- Tricuspid valve:   The valve appears to be grossly normal. Doppler:  There was mild regurgitation.  ------------------------------------------------------------------- Pulmonary artery:   Systolic pressure was within the normal range.  ------------------------------------------------------------------- Right atrium:  The atrium was mildly dilated.  ------------------------------------------------------------------- Pericardium:  There was no pericardial effusion.  ------------------------------------------------------------------- Measurements  Left ventricle                         Value        Reference LV ID, ED, PLAX chordal        (L)     41.3  mm     43 - 52 LV ID, ES, PLAX chordal                29.8  mm     23 - 38 LV fx shortening, PLAX chordal (L)     28    %      >=29 LV PW thickness, ED                    12.2  mm     --------- IVS/LV PW ratio, ED                    1.03         <=1.3 LV e&', lateral                         10.7  cm/s   --------- LV E/e&', lateral                       7.43         --------- LV e&', medial                          9.43  cm/s   --------- LV E/e&', medial                        8.43         --------- LV e&', average                          10.07 cm/s   --------- LV E/e&', average                       7.9          ---------  Ventricular septum                     Value        Reference IVS thickness, ED                      12.6  mm     ---------  LVOT  Value        Reference LVOT ID, S                             22    mm     --------- LVOT area                              3.8   cm^2   ---------  Aorta                                  Value        Reference Aortic root ID, ED                     40    mm     ---------  Left atrium                            Value        Reference LA ID, A-P, ES                         32    mm     --------- LA ID/bsa, A-P                         1.54  cm/m^2 <=2.2 LA volume, S                           46    ml     --------- LA volume/bsa, S                       22.1  ml/m^2 --------- LA volume, ES, 1-p A4C                 37    ml     --------- LA volume/bsa, ES, 1-p A4C             17.8  ml/m^2 --------- LA volume, ES, 1-p A2C                 49    ml     --------- LA volume/bsa, ES, 1-p A2C             23.6  ml/m^2 ---------  Mitral valve                           Value        Reference Mitral E-wave peak velocity            79.5  cm/s   --------- Mitral A-wave peak velocity            77    cm/s   --------- Mitral deceleration time               180   ms     150 - 230 Mitral peak gradient, D                3     mm Hg  --------- Mitral E/A ratio, peak  1            ---------  Pulmonary arteries                     Value        Reference PA pressure, S, DP                     22    mm Hg  <=30  Tricuspid valve                        Value        Reference Tricuspid regurg peak velocity         217   cm/s   --------- Tricuspid peak RV-RA gradient          19    mm Hg  ---------  Systemic veins                         Value        Reference Estimated CVP                          3     mm Hg  ---------  Right ventricle                         Value        Reference RV pressure, S, DP                     22    mm Hg  <=30 RV s&', lateral, S                      12    cm/s   ---------  Legend: (L)  and  (H)  mark values outside specified reference range.  ------------------------------------------------------------------- Prepared and Electronically Authenticated by  Aleene Passe, M.D. 2018-08-15T17:43:21          ______________________________________________________________________________________________      Risk Assessment/Calculations:     No BP recorded.  {Refresh Note OR Click here to enter BP  :1}***        Physical Exam:     VS:  There were no vitals taken for this visit.     Wt Readings from Last 3 Encounters:  07/09/24 190 lb (86.2 kg)  06/04/18 179 lb 6.4 oz (81.4 kg)  12/06/17 179 lb 12.8 oz (81.6 kg)     GEN: Well nourished, well developed, in no acute distress NECK: No JVD; No carotid bruits CARDIAC: RRR, no murmurs, rubs, gallops RESPIRATORY:  Clear to auscultation without rales, wheezing or rhonchi  ABDOMEN: Soft, non-tender, non-distended, normal bowel sounds EXTREMITIES:  Warm and well perfused, no edema; No deformity, 2+ radial pulses PSYCH: Normal mood and affect   Assessment & Plan   #NSTEMI s/p PCI to RCA (2018)   #HTN  #HLD        This note was written with the assistance of a dictation microphone or AI dictation software. Please excuse any typos or grammatical errors.   Signed, Georganna Archer, MD 08/26/2024 8:16 PM    [facility]  "

## 2024-08-27 ENCOUNTER — Encounter: Payer: Self-pay | Admitting: Student in an Organized Health Care Education/Training Program

## 2024-08-27 ENCOUNTER — Ambulatory Visit
Attending: Student in an Organized Health Care Education/Training Program | Admitting: Student in an Organized Health Care Education/Training Program

## 2024-08-27 VITALS — BP 126/72 | HR 60 | Ht 70.0 in | Wt 196.0 lb

## 2024-08-27 DIAGNOSIS — I10 Essential (primary) hypertension: Secondary | ICD-10-CM | POA: Diagnosis not present

## 2024-08-27 DIAGNOSIS — I214 Non-ST elevation (NSTEMI) myocardial infarction: Secondary | ICD-10-CM

## 2024-08-27 DIAGNOSIS — E785 Hyperlipidemia, unspecified: Secondary | ICD-10-CM

## 2024-08-27 LAB — LIPID PANEL
Chol/HDL Ratio: 2.3 ratio (ref 0.0–5.0)
Cholesterol, Total: 112 mg/dL (ref 100–199)
HDL: 49 mg/dL
LDL Chol Calc (NIH): 44 mg/dL (ref 0–99)
Triglycerides: 105 mg/dL (ref 0–149)
VLDL Cholesterol Cal: 19 mg/dL (ref 5–40)

## 2024-08-27 NOTE — Patient Instructions (Signed)
" °  Lab Work: Lipid panel today at [facility] downstairs  If you have labs (blood work) drawn today and your tests are completely normal, you will receive your results only by: [employer] (if you have MyChart) OR A paper copy in the mail If you have any lab test that is abnormal or we need to change your treatment, we will call you to review the results.  Follow-Up: At [facility], you and your health needs are our priority.  As part of our continuing mission to provide you with exceptional heart care, our providers are all part of one team.  This team includes your primary Cardiologist (physician) and Advanced Practice Providers or APPs (Physician Assistants and Nurse Practitioners) who all work together to provide you with the care you need, when you need it.  Your next appointment:   6 month(s)  Provider:   Georganna Archer, MD  We recommend signing up for the patient portal called MyChart.  Sign up information is provided on this After Visit Summary.  MyChart is used to connect with patients for Virtual Visits (Telemedicine).  Patients are able to view lab/test results, encounter notes, upcoming appointments, etc.  Non-urgent messages can be sent to your provider as well.   To learn more about what you can do with MyChart, go to [email or web address].           "

## 2024-09-03 ENCOUNTER — Other Ambulatory Visit: Payer: Self-pay | Admitting: Student in an Organized Health Care Education/Training Program
